# Patient Record
Sex: Female | Born: 1985 | Race: White | Hispanic: No | Marital: Married | Smoking: Never smoker
Health system: Southern US, Community
[De-identification: ages and names within clinical notes are randomized; demographics above are authoritative.]

## PROBLEM LIST (undated history)

## (undated) DIAGNOSIS — J45909 Unspecified asthma, uncomplicated: Secondary | ICD-10-CM

## (undated) DIAGNOSIS — R079 Chest pain, unspecified: Secondary | ICD-10-CM

## (undated) DIAGNOSIS — Z86718 Personal history of other venous thrombosis and embolism: Secondary | ICD-10-CM

## (undated) DIAGNOSIS — D682 Hereditary deficiency of other clotting factors: Secondary | ICD-10-CM

## (undated) HISTORY — DX: Chest pain, unspecified: R07.9

## (undated) HISTORY — PX: TONSILLECTOMY: SUR1361

## (undated) HISTORY — PX: APPENDECTOMY: SHX54

---

## 2009-10-31 DIAGNOSIS — G43909 Migraine, unspecified, not intractable, without status migrainosus: Secondary | ICD-10-CM | POA: Insufficient documentation

## 2017-08-31 DIAGNOSIS — D689 Coagulation defect, unspecified: Secondary | ICD-10-CM | POA: Insufficient documentation

## 2017-08-31 DIAGNOSIS — O321XX0 Maternal care for breech presentation, not applicable or unspecified: Secondary | ICD-10-CM | POA: Insufficient documentation

## 2017-08-31 DIAGNOSIS — R768 Other specified abnormal immunological findings in serum: Secondary | ICD-10-CM | POA: Insufficient documentation

## 2018-07-14 LAB — OB RESULTS CONSOLE HEPATITIS B SURFACE ANTIGEN: Hepatitis B Surface Ag: NEGATIVE

## 2018-07-14 LAB — OB RESULTS CONSOLE GC/CHLAMYDIA
Chlamydia: NEGATIVE
Gonorrhea: NEGATIVE

## 2018-07-14 LAB — OB RESULTS CONSOLE ANTIBODY SCREEN: Antibody Screen: NEGATIVE

## 2018-07-14 LAB — OB RESULTS CONSOLE HIV ANTIBODY (ROUTINE TESTING): HIV: NONREACTIVE

## 2018-07-14 LAB — OB RESULTS CONSOLE ABO/RH: RH Type: POSITIVE

## 2018-07-14 LAB — OB RESULTS CONSOLE RUBELLA ANTIBODY, IGM: Rubella: IMMUNE

## 2018-07-14 LAB — OB RESULTS CONSOLE RPR: RPR: NONREACTIVE

## 2019-01-06 ENCOUNTER — Telehealth (HOSPITAL_COMMUNITY): Payer: Self-pay | Admitting: *Deleted

## 2019-01-06 ENCOUNTER — Encounter (HOSPITAL_COMMUNITY): Payer: Self-pay | Admitting: *Deleted

## 2019-01-06 NOTE — Telephone Encounter (Signed)
Preadmission screen  

## 2019-01-08 ENCOUNTER — Encounter (HOSPITAL_COMMUNITY): Payer: Self-pay

## 2019-01-18 ENCOUNTER — Telehealth (HOSPITAL_COMMUNITY): Payer: Self-pay | Admitting: *Deleted

## 2019-01-18 NOTE — H&P (Signed)
Makayla Kaiser is a 33 y.o. female presenting for repeat cesarean section. 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 Hx of DVT while on OCPs. Lovenox 40mg  daily this pregnancy. OB History    Gravida  3   Para  1   Term      Preterm      AB      Living  1     SAB      TAB      Ectopic      Multiple      Live Births  1          Past Medical History:  Diagnosis Date  . Asthma   . Factor II deficiency (HCC)   . History of DVT (deep vein thrombosis)    Past Surgical History:  Procedure Laterality Date  . APPENDECTOMY    . CESAREAN SECTION    . TONSILLECTOMY     Family History: family history includes Clotting disorder in her sister; Factor V Leiden deficiency in her mother and sister; Heart disease in her mother; Hypothyroidism in her mother. Social History:  reports that she has never smoked. She has never used smokeless tobacco. She reports previous alcohol use. She reports that she does not use drugs.     Maternal Diabetes: No Genetic Screening: Normal Maternal Ultrasounds/Referrals: Normal Fetal Ultrasounds or other Referrals:  None Maternal Substance Abuse:  No Significant Maternal Medications:  Meds include: Other: Lovenox Significant Maternal Lab Results:  None Other Comments:  None  Review of Systems  Eyes: Negative for blurred vision.  Gastrointestinal: Negative for vomiting.  Neurological: Negative for headaches.   Maternal Medical History:  Fetal activity: Perceived fetal activity is normal.        Last menstrual period 04/28/2018. Exam Physical Exam  Cardiovascular: Normal rate and regular rhythm.  Respiratory: Effort normal and breath sounds normal.  GI: Soft.    Prenatal labs: ABO, Rh: B/Positive/-- (09/03 0000) Antibody: Negative (09/03 0000) Rubella: Immune (09/03 0000) RPR: Nonreactive (09/03 0000)  HBsAg: Negative (09/03 0000)  HIV: Non-reactive (09/03 0000)  GBS:   negative   01/06/19  Assessment/Plan: 33 yo G3P1 at term for repeat cesarean section Factor II Prothrombin mutation with Hx of DVT on prophylactic Lovenox   Roselle Locus II 01/18/2019, 3:44 PM

## 2019-01-18 NOTE — Telephone Encounter (Signed)
Spoke with Dr Armond Hang regarding Lovenox usage.  Instructions received to hold 3/10 dose due to CS on 3/11.  Unable to leave voicemail on pt phone.  Left message with pt physician office

## 2019-01-18 NOTE — Patient Instructions (Signed)
01/20/2019 Makayla Kaiser  01/18/2019   Your procedure is scheduled on:  01/20/2019  Arrive at 0615 at Entrance C on [address] at [facility] and [employer]. You are invited to use the FREE valet parking or use the   Visitor's parking deck.  Pick up the phone at the desk and dial [phone].  Call this number if you have problems the morning of surgery: [phone]  Remember:   Do not eat food:(After Midnight) Desps de medianoche.  Do not drink clear liquids: (After Midnight) Desps de medianoche.  Take these medicines the morning of surgery with A SIP OF WATER:  Do not take your lovenox on 3/10 or 3/11.  Bring your inhaler with you.   Do not wear jewelry, make-up or nail polish.  Do not wear lotions, powders, or perfumes. Do not wear deodorant.  Do not shave 48 hours prior to surgery.  Do not bring valuables to the hospital.  [facility] is not   responsible for any belongings or valuables brought to the hospital.  Contacts, dentures or bridgework may not be worn into surgery.  Leave suitcase in the car. After surgery it may be brought to your room.  For patients admitted to the hospital, checkout time is 11:00 AM the day of              discharge.      Please read over the following fact sheets that you were given:     Preparing for Surgery

## 2019-01-19 ENCOUNTER — Encounter (HOSPITAL_COMMUNITY)
Admission: RE | Admit: 2019-01-19 | Discharge: 2019-01-19 | Disposition: A | Discharge status: Home / Self Care | Payer: BLUE CROSS/BLUE SHIELD | Source: Ambulatory Visit | Attending: Pediatrics | Admitting: Pediatrics

## 2019-01-19 DIAGNOSIS — Z01812 Encounter for preprocedural laboratory examination: Secondary | ICD-10-CM | POA: Insufficient documentation

## 2019-01-19 HISTORY — DX: Personal history of other venous thrombosis and embolism: Z86.718

## 2019-01-19 HISTORY — DX: Unspecified asthma, uncomplicated: J45.909

## 2019-01-19 HISTORY — DX: Hereditary deficiency of other clotting factors: D68.2

## 2019-01-19 LAB — CBC
HCT: 35.8 % — ABNORMAL LOW (ref 36.0–46.0)
Hemoglobin: 11.5 g/dL — ABNORMAL LOW (ref 12.0–15.0)
MCH: 28.8 pg (ref 26.0–34.0)
MCHC: 32.1 g/dL (ref 30.0–36.0)
MCV: 89.7 fL (ref 80.0–100.0)
Platelets: 233 10*3/uL (ref 150–400)
RBC: 3.99 MIL/uL (ref 3.87–5.11)
RDW: 12.1 % (ref 11.5–15.5)
WBC: 7 10*3/uL (ref 4.0–10.5)
nRBC: 0 % (ref 0.0–0.2)

## 2019-01-19 LAB — COMPREHENSIVE METABOLIC PANEL
ALT: 21 U/L (ref 0–44)
AST: 22 U/L (ref 15–41)
Albumin: 2.9 g/dL — ABNORMAL LOW (ref 3.5–5.0)
Alkaline Phosphatase: 468 U/L — ABNORMAL HIGH (ref 38–126)
Anion gap: 9 (ref 5–15)
BILIRUBIN TOTAL: 0.4 mg/dL (ref 0.3–1.2)
BUN: 8 mg/dL (ref 6–20)
CO2: 21 mmol/L — ABNORMAL LOW (ref 22–32)
Calcium: 8.8 mg/dL — ABNORMAL LOW (ref 8.9–10.3)
Chloride: 106 mmol/L (ref 98–111)
Creatinine, Ser: 0.73 mg/dL (ref 0.44–1.00)
GFR calc Af Amer: >60 mL/min (ref >60)
GFR calc non Af Amer: >60 mL/min (ref >60)
Glucose, Bld: 99 mg/dL (ref 70–99)
Potassium: 3.6 mmol/L (ref 3.5–5.1)
Sodium: 136 mmol/L (ref 135–145)
TOTAL PROTEIN: 6.5 g/dL (ref 6.5–8.1)

## 2019-01-19 LAB — TYPE AND SCREEN
ABO/RH(D): B POS
Antibody Screen: NEGATIVE

## 2019-01-19 LAB — ABO/RH: ABO/RH(D): B POS

## 2019-01-19 NOTE — Anesthesia Preprocedure Evaluation (Addendum)
Anesthesia Evaluation  Patient identified by MRN, date of birth, ID band Patient awake    Reviewed: Allergy & Precautions, NPO status , Patient's Chart, lab work & pertinent test results  Airway Mallampati: I  TM Distance: >3 FB Neck ROM: Full    Dental no notable dental hx. (+) Teeth Intact   Pulmonary asthma ,    Pulmonary exam normal breath sounds clear to auscultation       Cardiovascular Exercise Tolerance: Good + DVT  negative cardio ROS Normal cardiovascular exam Rhythm:Regular Rate:Normal  Hx of DVT  Hx of Factor 2 Prothrombin mutation on Lovenox    Neuro/Psych negative neurological ROS  negative psych ROS   GI/Hepatic negative GI ROS, Neg liver ROS,   Endo/Other  negative endocrine ROS  Renal/GU Cr 0.73     Musculoskeletal   Abdominal   Peds  Hematology Hgb 11.5 Plt 233 T & S available   Anesthesia Other Findings   Reproductive/Obstetrics (+) Pregnancy                            Anesthesia Physical Anesthesia Plan  ASA: II  Anesthesia Plan: Spinal   Post-op Pain Management:    Induction:   PONV Risk Score and Plan: 2 and Treatment may vary due to age or medical condition, Ondansetron and Dexamethasone  Airway Management Planned: Natural Airway and Nasal Cannula  Additional Equipment:   Intra-op Plan:   Post-operative Plan:   Informed Consent: I have reviewed the patients History and Physical, chart, labs and discussed the procedure including the risks, benefits and alternatives for the proposed anesthesia with the patient or authorized representative who has indicated his/her understanding and acceptance.     Dental advisory given  Plan Discussed with: CRNA  Anesthesia Plan Comments: (Repeat C/S for Patient with hx of DVT on Lovenox last dose 3/9  T&S available)       Anesthesia Quick Evaluation

## 2019-01-20 ENCOUNTER — Encounter (HOSPITAL_COMMUNITY): Payer: Self-pay | Admitting: *Deleted

## 2019-01-20 ENCOUNTER — Inpatient Hospital Stay (HOSPITAL_COMMUNITY)
Admission: RE | Admit: 2019-01-20 | Discharge: 2019-01-21 | DRG: 787 | Disposition: A | Discharge status: Home / Self Care | Payer: BLUE CROSS/BLUE SHIELD | Attending: Obstetrics and Gynecology | Admitting: Obstetrics and Gynecology

## 2019-01-20 ENCOUNTER — Encounter (HOSPITAL_COMMUNITY)
Admission: RE | Disposition: A | Discharge status: Home / Self Care | Payer: Self-pay | Source: Home / Self Care | Attending: Obstetrics and Gynecology

## 2019-01-20 ENCOUNTER — Inpatient Hospital Stay (HOSPITAL_COMMUNITY): Payer: BLUE CROSS/BLUE SHIELD | Admitting: Anesthesiology

## 2019-01-20 DIAGNOSIS — O9912 Other diseases of the blood and blood-forming organs and certain disorders involving the immune mechanism complicating childbirth: Secondary | ICD-10-CM | POA: Diagnosis present

## 2019-01-20 DIAGNOSIS — D6852 Prothrombin gene mutation: Secondary | ICD-10-CM | POA: Diagnosis present

## 2019-01-20 DIAGNOSIS — Z3A38 38 weeks gestation of pregnancy: Secondary | ICD-10-CM | POA: Diagnosis not present

## 2019-01-20 DIAGNOSIS — J45909 Unspecified asthma, uncomplicated: Secondary | ICD-10-CM | POA: Diagnosis present

## 2019-01-20 DIAGNOSIS — O9952 Diseases of the respiratory system complicating childbirth: Secondary | ICD-10-CM | POA: Diagnosis present

## 2019-01-20 DIAGNOSIS — O34211 Maternal care for low transverse scar from previous cesarean delivery: Principal | ICD-10-CM | POA: Diagnosis present

## 2019-01-20 DIAGNOSIS — Z98891 History of uterine scar from previous surgery: Secondary | ICD-10-CM

## 2019-01-20 DIAGNOSIS — Z86718 Personal history of other venous thrombosis and embolism: Secondary | ICD-10-CM

## 2019-01-20 LAB — RPR: RPR Ser Ql: NONREACTIVE

## 2019-01-20 SURGERY — Surgical Case
Anesthesia: Spinal

## 2019-01-20 MED ORDER — ALBUTEROL SULFATE (2.5 MG/3ML) 0.083% IN NEBU: 3.0000 mL | INHALATION_SOLUTION | Freq: Four times a day (QID) | RESPIRATORY_TRACT | Status: DC | PRN

## 2019-01-20 MED ORDER — NALOXONE HCL 0.4 MG/ML IJ SOLN: 0.4000 mg | INTRAMUSCULAR | Status: DC | PRN

## 2019-01-20 MED ORDER — DIPHENHYDRAMINE HCL 25 MG PO CAPS: 25.0000 mg | ORAL_CAPSULE | Freq: Four times a day (QID) | ORAL | Status: DC | PRN

## 2019-01-20 MED ORDER — LACTATED RINGERS IV SOLN
INTRAVENOUS | Status: DC
  Administered 2019-01-20: 15:00:00 via INTRAVENOUS

## 2019-01-20 MED ORDER — COCONUT OIL OIL: 1.0000 "application " | TOPICAL_OIL | Status: DC | PRN

## 2019-01-20 MED ORDER — OXYCODONE HCL 5 MG PO TABS: 5.0000 mg | ORAL_TABLET | Freq: Once | ORAL | Status: DC | PRN

## 2019-01-20 MED ORDER — PHENYLEPHRINE HCL-NACL 20-0.9 MG/250ML-% IV SOLN
INTRAVENOUS | Status: AC
  Filled 2019-01-20: qty 250

## 2019-01-20 MED ORDER — SOD CITRATE-CITRIC ACID 500-334 MG/5ML PO SOLN
30.0000 mL | ORAL | Status: AC
  Administered 2019-01-20: 30 mL via ORAL

## 2019-01-20 MED ORDER — OXYTOCIN 40 UNITS IN NORMAL SALINE INFUSION - SIMPLE MED
INTRAVENOUS | Status: AC
  Filled 2019-01-20: qty 1000

## 2019-01-20 MED ORDER — SODIUM CHLORIDE 0.9% FLUSH: 3.0000 mL | INTRAVENOUS | Status: DC | PRN

## 2019-01-20 MED ORDER — EPHEDRINE 5 MG/ML INJ
10.0000 mg | INTRAVENOUS | Status: DC | PRN
  Filled 2019-01-20: qty 2

## 2019-01-20 MED ORDER — OXYCODONE HCL 5 MG PO TABS: 5.0000 mg | ORAL_TABLET | ORAL | Status: DC | PRN

## 2019-01-20 MED ORDER — SIMETHICONE 80 MG PO CHEW
80.0000 mg | CHEWABLE_TABLET | Freq: Three times a day (TID) | ORAL | Status: DC
  Administered 2019-01-20 – 2019-01-21 (×3): 80 mg via ORAL
  Filled 2019-01-20 (×3): qty 1

## 2019-01-20 MED ORDER — PRENATAL MULTIVITAMIN CH
1.0000 | ORAL_TABLET | Freq: Every day | ORAL | Status: DC
  Administered 2019-01-21: 1 via ORAL
  Filled 2019-01-20: qty 1

## 2019-01-20 MED ORDER — TETANUS-DIPHTH-ACELL PERTUSSIS 5-2.5-18.5 LF-MCG/0.5 IM SUSP: 0.5000 mL | Freq: Once | INTRAMUSCULAR | Status: DC

## 2019-01-20 MED ORDER — OXYTOCIN 10 UNIT/ML IJ SOLN
INTRAVENOUS | Status: DC | PRN
  Administered 2019-01-20: 40 [IU] via INTRAVENOUS

## 2019-01-20 MED ORDER — KETOROLAC TROMETHAMINE 30 MG/ML IJ SOLN
30.0000 mg | Freq: Four times a day (QID) | INTRAMUSCULAR | Status: AC | PRN
  Filled 2019-01-20 (×2): qty 1

## 2019-01-20 MED ORDER — OXYTOCIN 10 UNIT/ML IJ SOLN
INTRAMUSCULAR | Status: AC
  Filled 2019-01-20: qty 4

## 2019-01-20 MED ORDER — ONDANSETRON HCL 4 MG/2ML IJ SOLN: 4.0000 mg | Freq: Once | INTRAMUSCULAR | Status: DC | PRN

## 2019-01-20 MED ORDER — PHENYLEPHRINE 40 MCG/ML (10ML) SYRINGE FOR IV PUSH (FOR BLOOD PRESSURE SUPPORT)
80.0000 ug | PREFILLED_SYRINGE | INTRAVENOUS | Status: DC | PRN
  Filled 2019-01-20: qty 10

## 2019-01-20 MED ORDER — DIPHENHYDRAMINE HCL 25 MG PO CAPS: 25.0000 mg | ORAL_CAPSULE | ORAL | Status: DC | PRN

## 2019-01-20 MED ORDER — SIMETHICONE 80 MG PO CHEW
80.0000 mg | CHEWABLE_TABLET | ORAL | Status: DC | PRN
  Administered 2019-01-21: 80 mg via ORAL
  Filled 2019-01-20: qty 1

## 2019-01-20 MED ORDER — MENTHOL 3 MG MT LOZG: 1.0000 | LOZENGE | OROMUCOSAL | Status: DC | PRN

## 2019-01-20 MED ORDER — OXYTOCIN 40 UNITS IN NORMAL SALINE INFUSION - SIMPLE MED: 2.5000 [IU]/h | INTRAVENOUS | Status: AC

## 2019-01-20 MED ORDER — FENTANYL CITRATE (PF) 100 MCG/2ML IJ SOLN
INTRAMUSCULAR | Status: DC | PRN
  Administered 2019-01-20: 15 ug via INTRATHECAL

## 2019-01-20 MED ORDER — ONDANSETRON HCL 4 MG/2ML IJ SOLN
INTRAMUSCULAR | Status: AC
  Filled 2019-01-20: qty 2

## 2019-01-20 MED ORDER — NALBUPHINE HCL 10 MG/ML IJ SOLN: 5.0000 mg | Freq: Once | INTRAMUSCULAR | Status: DC | PRN

## 2019-01-20 MED ORDER — SENNOSIDES-DOCUSATE SODIUM 8.6-50 MG PO TABS
2.0000 | ORAL_TABLET | ORAL | Status: DC
  Administered 2019-01-21: 2 via ORAL
  Filled 2019-01-20: qty 2

## 2019-01-20 MED ORDER — MORPHINE SULFATE (PF) 0.5 MG/ML IJ SOLN
INTRAMUSCULAR | Status: DC | PRN
  Administered 2019-01-20: 150 ug via INTRATHECAL

## 2019-01-20 MED ORDER — MORPHINE SULFATE (PF) 0.5 MG/ML IJ SOLN
INTRAMUSCULAR | Status: AC
  Filled 2019-01-20: qty 10

## 2019-01-20 MED ORDER — FENTANYL CITRATE (PF) 100 MCG/2ML IJ SOLN
INTRAMUSCULAR | Status: AC
  Filled 2019-01-20: qty 2

## 2019-01-20 MED ORDER — KETOROLAC TROMETHAMINE 30 MG/ML IJ SOLN
30.0000 mg | Freq: Four times a day (QID) | INTRAMUSCULAR | Status: AC | PRN
  Administered 2019-01-20 – 2019-01-21 (×2): 30 mg via INTRAVENOUS

## 2019-01-20 MED ORDER — KETOROLAC TROMETHAMINE 30 MG/ML IJ SOLN
30.0000 mg | Freq: Once | INTRAMUSCULAR | Status: AC | PRN
  Administered 2019-01-20: 30 mg via INTRAVENOUS

## 2019-01-20 MED ORDER — NALBUPHINE HCL 10 MG/ML IJ SOLN: 5.0000 mg | INTRAMUSCULAR | Status: DC | PRN

## 2019-01-20 MED ORDER — ENOXAPARIN SODIUM 60 MG/0.6ML ~~LOC~~ SOLN
50.0000 mg | SUBCUTANEOUS | Status: DC
  Administered 2019-01-21: 50 mg via SUBCUTANEOUS
  Filled 2019-01-20: qty 0.6

## 2019-01-20 MED ORDER — MEPERIDINE HCL 25 MG/ML IJ SOLN: 6.2500 mg | INTRAMUSCULAR | Status: DC | PRN

## 2019-01-20 MED ORDER — WITCH HAZEL-GLYCERIN EX PADS: 1.0000 "application " | MEDICATED_PAD | CUTANEOUS | Status: DC | PRN

## 2019-01-20 MED ORDER — SOD CITRATE-CITRIC ACID 500-334 MG/5ML PO SOLN
ORAL | Status: AC
  Filled 2019-01-20: qty 15

## 2019-01-20 MED ORDER — ZOLPIDEM TARTRATE 5 MG PO TABS: 5.0000 mg | ORAL_TABLET | Freq: Every evening | ORAL | Status: DC | PRN

## 2019-01-20 MED ORDER — ONDANSETRON HCL 4 MG/2ML IJ SOLN
INTRAMUSCULAR | Status: DC | PRN
  Administered 2019-01-20: 4 mg via INTRAVENOUS

## 2019-01-20 MED ORDER — FENTANYL-BUPIVACAINE-NACL 0.5-0.125-0.9 MG/250ML-% EP SOLN: 12.0000 mL/h | EPIDURAL | Status: DC | PRN

## 2019-01-20 MED ORDER — SIMETHICONE 80 MG PO CHEW
80.0000 mg | CHEWABLE_TABLET | ORAL | Status: DC
  Administered 2019-01-21: 80 mg via ORAL
  Filled 2019-01-20: qty 1

## 2019-01-20 MED ORDER — HYDROMORPHONE HCL 1 MG/ML IJ SOLN: 0.2500 mg | INTRAMUSCULAR | Status: DC | PRN

## 2019-01-20 MED ORDER — DIPHENHYDRAMINE HCL 50 MG/ML IJ SOLN: 12.5000 mg | INTRAMUSCULAR | Status: DC | PRN

## 2019-01-20 MED ORDER — DIBUCAINE 1 % RE OINT: 1.0000 "application " | TOPICAL_OINTMENT | RECTAL | Status: DC | PRN

## 2019-01-20 MED ORDER — KETOROLAC TROMETHAMINE 30 MG/ML IJ SOLN
INTRAMUSCULAR | Status: AC
  Filled 2019-01-20: qty 1

## 2019-01-20 MED ORDER — PHENYLEPHRINE HCL-NACL 20-0.9 MG/250ML-% IV SOLN
INTRAVENOUS | Status: DC | PRN
  Administered 2019-01-20: 60 ug/min via INTRAVENOUS

## 2019-01-20 MED ORDER — NALOXONE HCL 4 MG/10ML IJ SOLN
1.0000 ug/kg/h | INTRAVENOUS | Status: DC | PRN
  Filled 2019-01-20: qty 5

## 2019-01-20 MED ORDER — BUPIVACAINE IN DEXTROSE 0.75-8.25 % IT SOLN
INTRATHECAL | Status: DC | PRN
  Administered 2019-01-20: 1.5 mL via INTRATHECAL

## 2019-01-20 MED ORDER — CEFAZOLIN SODIUM-DEXTROSE 2-4 GM/100ML-% IV SOLN
2.0000 g | INTRAVENOUS | Status: AC
  Administered 2019-01-20: 2 g via INTRAVENOUS

## 2019-01-20 MED ORDER — CEFAZOLIN SODIUM-DEXTROSE 2-4 GM/100ML-% IV SOLN
INTRAVENOUS | Status: AC
  Filled 2019-01-20: qty 100

## 2019-01-20 MED ORDER — OXYCODONE HCL 5 MG/5ML PO SOLN: 5.0000 mg | Freq: Once | ORAL | Status: DC | PRN

## 2019-01-20 MED ORDER — LACTATED RINGERS IV SOLN
INTRAVENOUS | Status: DC
  Administered 2019-01-20 (×3): via INTRAVENOUS

## 2019-01-20 MED ORDER — ACETAMINOPHEN 325 MG PO TABS
650.0000 mg | ORAL_TABLET | ORAL | Status: DC | PRN
  Administered 2019-01-21: 650 mg via ORAL
  Filled 2019-01-20: qty 2

## 2019-01-20 MED ORDER — SODIUM CHLORIDE 0.9 % IV SOLN
INTRAVENOUS | Status: DC | PRN
  Administered 2019-01-20: 09:00:00 via INTRAVENOUS

## 2019-01-20 MED ORDER — SCOPOLAMINE 1 MG/3DAYS TD PT72
MEDICATED_PATCH | TRANSDERMAL | Status: AC
  Filled 2019-01-20: qty 1

## 2019-01-20 MED ORDER — ONDANSETRON HCL 4 MG/2ML IJ SOLN: 4.0000 mg | Freq: Three times a day (TID) | INTRAMUSCULAR | Status: DC | PRN

## 2019-01-20 MED ORDER — LACTATED RINGERS IV SOLN: 500.0000 mL | Freq: Once | INTRAVENOUS | Status: DC

## 2019-01-20 MED ORDER — SCOPOLAMINE 1 MG/3DAYS TD PT72
1.0000 | MEDICATED_PATCH | Freq: Once | TRANSDERMAL | Status: DC
  Administered 2019-01-20: 1.5 mg via TRANSDERMAL

## 2019-01-20 SURGICAL SUPPLY — 33 items
BENZOIN TINCTURE PRP APPL 2/3 (GAUZE/BANDAGES/DRESSINGS) ×1 IMPLANT
CHLORAPREP W/TINT 26ML (MISCELLANEOUS) ×2 IMPLANT
CLAMP CORD UMBIL (MISCELLANEOUS) IMPLANT
CLOTH BEACON ORANGE TIMEOUT ST (SAFETY) ×2 IMPLANT
DERMABOND ADVANCED (GAUZE/BANDAGES/DRESSINGS)
DERMABOND ADVANCED .7 DNX12 (GAUZE/BANDAGES/DRESSINGS) IMPLANT
DRSG OPSITE POSTOP 4X10 (GAUZE/BANDAGES/DRESSINGS) ×2 IMPLANT
ELECT REM PT RETURN 9FT ADLT (ELECTROSURGICAL) ×2
ELECTRODE REM PT RTRN 9FT ADLT (ELECTROSURGICAL) ×1 IMPLANT
EXTRACTOR VACUUM M CUP 4 TUBE (SUCTIONS) IMPLANT
GLOVE BIO SURGEON STRL SZ7.5 (GLOVE) ×2 IMPLANT
GLOVE BIOGEL PI IND STRL 7.0 (GLOVE) ×1 IMPLANT
GLOVE BIOGEL PI INDICATOR 7.0 (GLOVE) ×1
GOWN STRL REUS W/TWL LRG LVL3 (GOWN DISPOSABLE) ×4 IMPLANT
KIT ABG SYR 3ML LUER SLIP (SYRINGE) ×2 IMPLANT
NDL HYPO 25X5/8 SAFETYGLIDE (NEEDLE) ×1 IMPLANT
NEEDLE HYPO 25X5/8 SAFETYGLIDE (NEEDLE) ×2 IMPLANT
NS IRRIG 1000ML POUR BTL (IV SOLUTION) ×2 IMPLANT
PACK C SECTION WH (CUSTOM PROCEDURE TRAY) ×2 IMPLANT
PAD OB MATERNITY 4.3X12.25 (PERSONAL CARE ITEMS) ×2 IMPLANT
PENCIL SMOKE EVAC W/HOLSTER (ELECTROSURGICAL) ×2 IMPLANT
STRIP CLOSURE SKIN 1/2X4 (GAUZE/BANDAGES/DRESSINGS) ×1 IMPLANT
SUT MNCRL 0 VIOLET CTX 36 (SUTURE) ×4 IMPLANT
SUT MONOCRYL 0 CTX 36 (SUTURE) ×4
SUT PDS AB 0 CTX 60 (SUTURE) ×2 IMPLANT
SUT PLAIN 0 NONE (SUTURE) IMPLANT
SUT PLAIN 2 0 (SUTURE)
SUT PLAIN 2 0 XLH (SUTURE) IMPLANT
SUT PLAIN ABS 2-0 CT1 27XMFL (SUTURE) IMPLANT
SUT VIC AB 4-0 KS 27 (SUTURE) ×2 IMPLANT
TOWEL OR 17X24 6PK STRL BLUE (TOWEL DISPOSABLE) ×2 IMPLANT
TRAY FOLEY W/BAG SLVR 14FR LF (SET/KITS/TRAYS/PACK) ×2 IMPLANT
WATER STERILE IRR 1000ML POUR (IV SOLUTION) ×2 IMPLANT

## 2019-01-20 NOTE — Op Note (Signed)
NAME: Makayla Kaiser, Makayla Kaiser [facility] MEDICAL RECORD [id number] ACCOUNT [id number] DATE OF BIRTH:11/11/1986 FACILITY: [facility] LOCATION: [id number] PHYSICIAN:Sweetie Giebler E. Iesha Summerhill II, MD  OPERATIVE REPORT  DATE OF PROCEDURE:  01/20/2019  PREOPERATIVE DIAGNOSES: 1.  Intrauterine pregnancy at 46 and 1/7 weeks. 2.  Previous cesarean section, desires repeat. 3.  Prothrombin factor II mutation.  POSTOPERATIVE DIAGNOSES:   1.  Intrauterine pregnancy at 89 and 1/7 weeks. 2.  Previous cesarean section, desires repeat. 3.  Prothrombin factor II mutation.  PROCEDURE:  Repeat low transverse cesarean section.  SURGEON:  Harold Hedge MD  ANESTHESIA:  Spinal.  ESTIMATED BLOOD LOSS:  Per anesthesia note.  FINDINGS:  Viable female infant.  Apgars, arterial pH and birth weight pending.  INDICATIONS AND CONSENT:  This patient is a 33 year old, G3 P1 with a previous cesarean section.  Pregnancy is complicated by a factor 2 prothrombin mutation and a history of DVT on the birth control pill.  She has been taking Lovenox 40 mg daily in a  prophylactic dose.  She took her last dose of Lovenox the day before yesterday.  She desires repeat cesarean section.  Potential risks and complications have been reviewed and all questions have been answered.  Consent is signed on the chart.  DESCRIPTION OF PROCEDURE:  The patient was taken to the operating room where her spinal anesthetic was placed per anesthesiology.  She was placed in the dorsal supine position with a 15 degree left lateral wedge.  She was prepped vaginally with Betadine.   Foley catheter was placed and prepped abdominally with ChloraPrep.  After a 3-minute drying time, timeout was undertaken and she was then draped in a sterile fashion.  After testing for adequate anesthesia, skin was entered through a Pfannenstiel scar.   Dissection was carried out in layers to the peritoneum which was taken down superiorly and inferiorly.  Vesicouterine peritoneum was  advanced.  The scar from the previous cesarean section has created a very thin window.  A low transverse incision was  made on the uterus just above this thin scar.  Clear fluid was noted.  The uterine incision was extended laterally with the fingers.  Baby was delivered from the vertex position.  There was a loose cord around the shoulders.  Good cry and tone is noted.   After 1 minute waiting time, the cord was clamped and cut and the baby was handed to waiting pediatrics team.  Placenta was manually delivered and sent to pathology.  Uterine cavity is clean.  Uterus was carefully closed in 2 running locking imbricating  layers of 0 Monocryl suture.  Good hemostasis was noted.  Lavage was carried out.  Anterior peritoneum was closed in a running fashion with 0 Monocryl suture, which was also used to reapproximate the pyramidalis muscle in the midline.  The anterior  rectus fascia was closed in a running fashion with a 0 looped PDS suture.  Subcutaneous layer was closed with interrupted plain suture and the skin was closed in a subcuticular fashion with 4-0 Vicryl on a Keith needle.  Dressings are applied.  All  counts were correct.  The patient was taken to recovery room in stable condition.  TN/NUANCE  D:01/20/2019 T:01/20/2019 JOB:[id number]

## 2019-01-20 NOTE — Brief Op Note (Signed)
01/20/2019  8:55 AM  PATIENT:  Makayla Kaiser  33 y.o. female  PRE-OPERATIVE DIAGNOSIS:  previous X 1, on lovenox  POST-OPERATIVE DIAGNOSIS:  Previous X1, desires repeat, on Lovenox  PROCEDURE:  Procedure(s) with comments: CESAREAN SECTION (N/A) - Repeat edc 02/02/19 NKDA Tracey RNFA  SURGEON:  Surgeon(s) and Role:    * Harold Hedge, MD - Primary  PHYSICIAN ASSISTANT:   ASSISTANTS: Pearson Forster RNFA   ANESTHESIA:   spinal  EBL:  Per anesthesia note  BLOOD ADMINISTERED:none  DRAINS: Urinary Catheter (Foley)   LOCAL MEDICATIONS USED:  NONE  SPECIMEN:  Source of Specimen:  placenta  DISPOSITION OF SPECIMEN:  PATHOLOGY  COUNTS:  YES  TOURNIQUET:  * No tourniquets in log *  DICTATION: .Other Dictation: Dictation Number [id number]  PLAN OF CARE: Admit to inpatient   PATIENT DISPOSITION:  PACU - hemodynamically stable.   Delay start of Pharmacological VTE agent (>24hrs) due to surgical blood loss or risk of bleeding: not applicable

## 2019-01-20 NOTE — Anesthesia Postprocedure Evaluation (Signed)
Anesthesia Post Note  Patient: Makayla Kaiser  Procedure(s) Performed: CESAREAN SECTION (N/A )     Patient location during evaluation: Mother Baby Anesthesia Type: Spinal Level of consciousness: awake and alert and oriented Pain management: satisfactory to patient Vital Signs Assessment: post-procedure vital signs reviewed and stable Respiratory status: respiratory function stable and spontaneous breathing Cardiovascular status: blood pressure returned to baseline Postop Assessment: no headache, no backache, spinal receding, patient able to bend at knees and adequate PO intake Anesthetic complications: no    Last Vitals:  Vitals:   01/20/19 1053 01/20/19 1058  BP: 99/64   Pulse: 69   Resp: 16   Temp:  37.1 C  SpO2:      Last Pain:  Vitals:   01/20/19 1353  TempSrc:   PainSc: 3    Pain Goal:                   Jeyson Deshotel

## 2019-01-20 NOTE — Progress Notes (Signed)
No changes to H&P per patient history Last Lovenox on 01/18/19 Reviewed with patient procedure-repeat cesarean section All questions answered

## 2019-01-20 NOTE — Transfer of Care (Signed)
Immediate Anesthesia Transfer of Care Note  Patient: Makayla Kaiser  Procedure(s) Performed: CESAREAN SECTION (N/A )  Patient Location: PACU  Anesthesia Type:Spinal  Level of Consciousness: awake, alert  and oriented  Airway & Oxygen Therapy: Patient Spontanous Breathing  Post-op Assessment: Report given to RN and Post -op Vital signs reviewed and stable  Post vital signs: Reviewed and stable  Last Vitals:  Vitals Value Taken Time  BP 93/69 01/20/2019  9:15 AM  Temp    Pulse 72 01/20/2019  9:18 AM  Resp 17 01/20/2019  9:18 AM  SpO2 93 % 01/20/2019  9:18 AM  Vitals shown include unvalidated device data.  Last Pain:  Vitals:   01/20/19 0223  TempSrc: Oral         Complications: No apparent anesthesia complications

## 2019-01-20 NOTE — Anesthesia Procedure Notes (Signed)
Spinal  Patient location during procedure: OB Start time: 01/20/2019 8:05 AM End time: 01/20/2019 8:12 AM Staffing Anesthesiologist: Trevor Iha, MD Performed: anesthesiologist  Preanesthetic Checklist Completed: patient identified, surgical consent, pre-op evaluation, timeout performed, IV checked, risks and benefits discussed and monitors and equipment checked Spinal Block Patient position: sitting Prep: site prepped and draped and DuraPrep Patient monitoring: heart rate, cardiac monitor, continuous pulse ox and blood pressure Approach: midline Location: L3-4 Injection technique: single-shot Needle Needle type: Pencan  Needle gauge: 24 G Needle length: 10 cm Needle insertion depth: 7 cm Assessment Sensory level: T4 Additional Notes 2 Attempt (s). Pt tolerated procedure well.

## 2019-01-21 LAB — CBC
HCT: 30.8 % — ABNORMAL LOW (ref 36.0–46.0)
Hemoglobin: 9.7 g/dL — ABNORMAL LOW (ref 12.0–15.0)
MCH: 28.6 pg (ref 26.0–34.0)
MCHC: 31.5 g/dL (ref 30.0–36.0)
MCV: 90.9 fL (ref 80.0–100.0)
Platelets: 180 10*3/uL (ref 150–400)
RBC: 3.39 MIL/uL — AB (ref 3.87–5.11)
RDW: 12.3 % (ref 11.5–15.5)
WBC: 8.4 10*3/uL (ref 4.0–10.5)
nRBC: 0 % (ref 0.0–0.2)

## 2019-01-21 MED ORDER — IBUPROFEN 800 MG PO TABS
800.0000 mg | ORAL_TABLET | Freq: Four times a day (QID) | ORAL | Status: DC | PRN
  Administered 2019-01-21: 800 mg via ORAL
  Filled 2019-01-21: qty 1

## 2019-01-21 MED ORDER — OXYCODONE-ACETAMINOPHEN 5-325 MG PO TABS: 1.0000 | ORAL_TABLET | ORAL | 0 refills | Status: DC | PRN

## 2019-01-21 MED ORDER — IBUPROFEN 800 MG PO TABS: 800.0000 mg | ORAL_TABLET | Freq: Four times a day (QID) | ORAL | 0 refills | Status: DC | PRN

## 2019-01-21 NOTE — Discharge Instructions (Signed)
Call MD for T>100.4, heavy vaginal bleeding, severe abdominal pain, intractable nausea and/or vomiting, or respiratory distress.  Call office to schedule postop incision check in 1 weeks.  Pelvic rest x 6 weeks.  No driving while taking narcotics.   °

## 2019-01-21 NOTE — Progress Notes (Signed)
Subjective: Postpartum Day 1: Cesarean Delivery Patient reports tolerating PO, + flatus and no problems voiding.   Patient adamantly wants to go home.  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    Objective: Vital signs in last 24 hours: Temp:  [97.8 F (36.6 C)-100 F (37.8 C)] 97.8 F (36.6 C) (03/12 0721) Pulse Rate:  [60-66] 64 (03/12 0721) Resp:  [16-19] 18 (03/12 0721) BP: (91-108)/(57-70) 92/70 (03/12 0721) SpO2:  [95 %-99 %] 98 % (03/12 0721)  Physical Exam:  General: alert, cooperative and appears stated age 33: appropriate Uterine Fundus: firm Incision: healing well, no significant drainage, no dehiscence, clean and dry dressing after change in honeycomb this am DVT Evaluation: No evidence of DVT seen on physical exam. Negative Homan's sign. No cords or calf tenderness.  Recent Labs    01/19/19 0933 01/21/19 0621  HGB 11.5* 9.7*  HCT 35.8* 30.8*    Assessment/Plan: Status post Cesarean section. Doing well postoperatively.  Patient counseled that routinely, we do not send C/S postop patients home until day 2.  Patient understands.  I spoke with her surgeon, Dr. Henderson Cloud, who reports the surgery was uncomplicated.  Patient is comfortable with precautions and will call or return should she have any concerns.   Discharge home with standard precautions and return to clinic in 1 week.  [name] 01/21/2019, 12:45 PM

## 2019-01-21 NOTE — Lactation Note (Signed)
This note was copied from a baby's chart. Lactation Consultation Note  Patient Name: Makayla Kaiser PYYFR'T Date: 01/21/2019 Reason for consult: Initial assessment;Early term 80-38.6wks  P2 mother whose infant is now 66 hours old.  Mother breast fed her first child (now 64 months old) for 9 months and stopped when she found out she was pregnant.  Pediatrician in room when I arrived.  Mother desires a discharge home at 24 hours and pediatrician aware of this.  Mother had no questions/concerns related to breast feeding and feels like baby is latching and feeding well.  Her breasts are soft and non tender and nipples are intact.  Mother will continue feeding 8-12 times/24 hours or sooner if baby shows feeding cues.  Engorgement prevention/treatment discussed.  Mother has a manual pump and 2 DEBPs for home use.  Encouraged her to call me if needed prior to discharge.   Maternal Data Formula Feeding for Exclusion: No Has patient been taught Hand Expression?: Yes Does the patient have breastfeeding experience prior to this delivery?: Yes  Feeding    LATCH Score                   Interventions    Lactation Tools Discussed/Used WIC Program: No   Consult Status Consult Status: Complete    Makayla Kaiser 01/21/2019, 10:37 AM

## 2019-01-21 NOTE — Progress Notes (Signed)
Upon fundal assessment this morning, RN noticed dressing to be moderately covered with drainage. Order obtained to change it. Dressing changed per order with assistance of Christella Hartigan, Charity fundraiser. Dressing now clean, dry, intact.

## 2019-01-21 NOTE — Discharge Summary (Signed)
Obstetric Discharge Summary Reason for Admission: cesarean section Prenatal Procedures: none Intrapartum Procedures: cesarean: low cervical, transverse Postpartum Procedures: none Complications-Operative and Postpartum: none Hemoglobin  Date Value Ref Range Status  01/21/2019 9.7 (L) 12.0 - 15.0 g/dL Final   HCT  Date Value Ref Range Status  01/21/2019 30.8 (L) 36.0 - 46.0 % Final    Physical Exam:  General: alert, cooperative and appears stated age 33: appropriate Uterine Fundus: firm Incision: healing well, no significant drainage, no dehiscence DVT Evaluation: No evidence of DVT seen on physical exam. Negative Homan's sign. No cords or calf tenderness.  Discharge Diagnoses: Term Pregnancy-delivered  Discharge Information: Date: 01/21/2019 Activity: pelvic rest Diet: routine Medications: PNV, Ibuprofen and Percocet Condition: stable Instructions: refer to practice specific booklet Discharge to: home   Newborn Data: Live born female  Birth Weight: 6 lb 1.7 oz (2770 g) APGAR: 9, 9  Newborn Delivery   Birth date/time:  01/20/2019 08:32:00 Delivery type:  C-Section, Low Transverse Trial of labor:  No C-section categorization:  Repeat     Home with mother.  [name] 01/21/2019, 12:51 PM

## 2019-01-21 NOTE — Lactation Note (Signed)
This note was copied from a baby's chart. Lactation Consultation Note  Patient Name: Makayla Kaiser [id number] Date: 01/21/2019  [facility] entered room at 04:50 am and Mom and infant asleep,   Maternal Data    Feeding Feeding Type: Breast Fed  LATCH Score                   Interventions    Lactation Tools Discussed/Used     Consult Status      Makayla Kaiser 01/21/2019, 6:37 AM

## 2020-12-21 LAB — OB RESULTS CONSOLE ABO/RH: RH Type: POSITIVE

## 2020-12-21 LAB — OB RESULTS CONSOLE RUBELLA ANTIBODY, IGM: Rubella: IMMUNE

## 2020-12-21 LAB — OB RESULTS CONSOLE HIV ANTIBODY (ROUTINE TESTING): HIV: NONREACTIVE

## 2020-12-21 LAB — OB RESULTS CONSOLE RPR: RPR: NONREACTIVE

## 2020-12-21 LAB — OB RESULTS CONSOLE HEPATITIS B SURFACE ANTIGEN: Hepatitis B Surface Ag: NEGATIVE

## 2020-12-21 LAB — OB RESULTS CONSOLE ANTIBODY SCREEN: Antibody Screen: NEGATIVE

## 2021-01-12 LAB — HM PAP SMEAR
HM Pap smear: NEGATIVE
HPV, high-risk: NEGATIVE

## 2021-01-12 LAB — RESULTS CONSOLE HPV: CHL HPV: NEGATIVE

## 2021-01-17 ENCOUNTER — Emergency Department (HOSPITAL_BASED_OUTPATIENT_CLINIC_OR_DEPARTMENT_OTHER): Payer: Commercial Managed Care - PPO | Admitting: Radiology

## 2021-01-17 ENCOUNTER — Emergency Department (HOSPITAL_BASED_OUTPATIENT_CLINIC_OR_DEPARTMENT_OTHER)
Admission: EM | Admit: 2021-01-17 | Discharge: 2021-01-17 | Disposition: A | Discharge status: Home / Self Care | Payer: Commercial Managed Care - PPO | Attending: Emergency Medicine | Admitting: Emergency Medicine

## 2021-01-17 ENCOUNTER — Other Ambulatory Visit: Payer: Self-pay

## 2021-01-17 ENCOUNTER — Encounter (HOSPITAL_BASED_OUTPATIENT_CLINIC_OR_DEPARTMENT_OTHER): Payer: Self-pay

## 2021-01-17 ENCOUNTER — Emergency Department (HOSPITAL_BASED_OUTPATIENT_CLINIC_OR_DEPARTMENT_OTHER): Payer: Commercial Managed Care - PPO

## 2021-01-17 DIAGNOSIS — Z79899 Other long term (current) drug therapy: Secondary | ICD-10-CM | POA: Insufficient documentation

## 2021-01-17 DIAGNOSIS — O99611 Diseases of the digestive system complicating pregnancy, first trimester: Secondary | ICD-10-CM | POA: Insufficient documentation

## 2021-01-17 DIAGNOSIS — J45909 Unspecified asthma, uncomplicated: Secondary | ICD-10-CM | POA: Insufficient documentation

## 2021-01-17 DIAGNOSIS — K8020 Calculus of gallbladder without cholecystitis without obstruction: Secondary | ICD-10-CM | POA: Diagnosis not present

## 2021-01-17 DIAGNOSIS — Z3A12 12 weeks gestation of pregnancy: Secondary | ICD-10-CM | POA: Diagnosis not present

## 2021-01-17 DIAGNOSIS — K859 Acute pancreatitis without necrosis or infection, unspecified: Secondary | ICD-10-CM | POA: Insufficient documentation

## 2021-01-17 LAB — COMPREHENSIVE METABOLIC PANEL
ALT: 18 U/L (ref 0–44)
AST: 18 U/L (ref 15–41)
Albumin: 4 g/dL (ref 3.5–5.0)
Alkaline Phosphatase: 60 U/L (ref 38–126)
Anion gap: 9 (ref 5–15)
BUN: 12 mg/dL (ref 6–20)
CO2: 25 mmol/L (ref 22–32)
Calcium: 9.3 mg/dL (ref 8.9–10.3)
Chloride: 103 mmol/L (ref 98–111)
Creatinine, Ser: 0.71 mg/dL (ref 0.44–1.00)
GFR, Estimated: >60 mL/min (ref >60)
Glucose, Bld: 78 mg/dL (ref 70–99)
Potassium: 3.7 mmol/L (ref 3.5–5.1)
Sodium: 137 mmol/L (ref 135–145)
Total Bilirubin: 0.2 mg/dL — ABNORMAL LOW (ref 0.3–1.2)
Total Protein: 7.3 g/dL (ref 6.5–8.1)

## 2021-01-17 LAB — CBC WITH DIFFERENTIAL/PLATELET
Abs Immature Granulocytes: 0.03 10*3/uL (ref 0.00–0.07)
Basophils Absolute: 0 10*3/uL (ref 0.0–0.1)
Basophils Relative: 0 %
Eosinophils Absolute: 0.1 10*3/uL (ref 0.0–0.5)
Eosinophils Relative: 1 %
HCT: 37.4 % (ref 36.0–46.0)
Hemoglobin: 12.4 g/dL (ref 12.0–15.0)
Immature Granulocytes: 0 %
Lymphocytes Relative: 20 %
Lymphs Abs: 1.8 10*3/uL (ref 0.7–4.0)
MCH: 29.5 pg (ref 26.0–34.0)
MCHC: 33.2 g/dL (ref 30.0–36.0)
MCV: 89 fL (ref 80.0–100.0)
Monocytes Absolute: 0.6 10*3/uL (ref 0.1–1.0)
Monocytes Relative: 7 %
Neutro Abs: 6.3 10*3/uL (ref 1.7–7.7)
Neutrophils Relative %: 72 %
Platelets: 260 10*3/uL (ref 150–400)
RBC: 4.2 MIL/uL (ref 3.87–5.11)
RDW: 12.3 % (ref 11.5–15.5)
WBC: 8.9 10*3/uL (ref 4.0–10.5)
nRBC: 0 % (ref 0.0–0.2)

## 2021-01-17 LAB — LIPASE, BLOOD: Lipase: 310 U/L — ABNORMAL HIGH (ref 11–51)

## 2021-01-17 LAB — TROPONIN I (HIGH SENSITIVITY)
Troponin I (High Sensitivity): 2 ng/L (ref <18)
Troponin I (High Sensitivity): 3 ng/L (ref <18)

## 2021-01-17 LAB — D-DIMER, QUANTITATIVE: D-Dimer, Quant: 0.3 ug/mL-FEU (ref 0.00–0.50)

## 2021-01-17 MED ORDER — ASPIRIN 81 MG PO CHEW
81.0000 mg | CHEWABLE_TABLET | Freq: Once | ORAL | Status: AC
  Administered 2021-01-17: 81 mg via ORAL
  Filled 2021-01-17: qty 1

## 2021-01-17 MED ORDER — FAMOTIDINE 20 MG PO TABS
20.0000 mg | ORAL_TABLET | Freq: Once | ORAL | Status: AC
  Administered 2021-01-17: 20 mg via ORAL
  Filled 2021-01-17: qty 1

## 2021-01-17 NOTE — ED Notes (Signed)
Patient transported to X-ray 

## 2021-01-17 NOTE — ED Triage Notes (Signed)
She c/o central chest pain which began earlier today while at rest. She tells me she is ~[redacted] weeks gestation, Grav III; PARA II. She further tells me she has a coagulopathy (Factor II issue). She is in no distress. her husband is at bedside.

## 2021-01-17 NOTE — ED Provider Notes (Signed)
[facility] EMERGENCY DEPT Provider Note   CSN: [id number] Arrival date & time: 01/17/21  1524     History Chief Complaint  Patient presents with  . Chest Pain    Makayla Kaiser is a 35 y.o. female.  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  Shortly prior to arrival, the patient was sitting when she began having lower central chest pain with pain in her upper back.  Pain is a squeezing tightness that is constant since it began and is associated with mild shortness of breath.  No pain with deep inspiration.  No nausea or vomiting and no associated abdominal pain.  She has had reflux with her pregnancy but states this feels different; she has never had pain like this before.  She denies any cough/cold symptoms, recent travel, or recent illness.  No leg swelling or pain.  Regarding her pregnancy, she is following with an OB/GYN for high risk pregnancy and is compliant with Lovenox.  She denies any pregnancy related complaints including no abdominal pain, vaginal bleeding/discharge.  FH notable for mother with sudden cardiac arrest in her 16s; required CABG.   The history is provided by the patient.  Chest Pain      Past Medical History:  Diagnosis Date  . Asthma   . Factor II deficiency (HCC)   . History of DVT (deep vein thrombosis)     Patient Active Problem List   Diagnosis Date Noted  . Previous cesarean section 01/20/2019  . Cesarean delivery delivered 01/20/2019    Past Surgical History:  Procedure Laterality Date  . APPENDECTOMY    . CESAREAN SECTION    . CESAREAN SECTION N/A 01/20/2019   Procedure: CESAREAN SECTION;  Surgeon: Harold Hedge, MD;  Location: [facility] LD [facility]S;  Service: Obstetrics;  Laterality: N/A;  Repeat edc 02/02/19 NKDA Tracey RNFA  . TONSILLECTOMY       OB History    Gravida  4   Para  2    Term  1   Preterm      AB      Living  2     SAB      IAB      Ectopic      Multiple  0   Live Births  2           Family History  Problem Relation Age of Onset  . Factor V Leiden deficiency Mother   . Hypothyroidism Mother   . Heart disease Mother   . Factor V Leiden deficiency Sister   . Clotting disorder Sister     Social History   Tobacco Use  . Smoking status: Never Smoker  . Smokeless tobacco: Never Used  Vaping Use  . Vaping Use: Never used  Substance Use Topics  . Alcohol use: Not Currently  . Drug use: Never    Home Medications Prior to Admission medications   Medication Sig Start Date End Date Taking? Authorizing Provider  acetaminophen (TYLENOL) 500 MG tablet Take 500 mg by mouth every 6 (six) hours as needed for moderate pain.    [provider]  albuterol (PROVENTIL HFA;VENTOLIN HFA) 108 (90 Base) MCG/ACT inhaler Inhale 1-2 puffs into the lungs every 6 (six) hours as needed for wheezing or shortness of breath.    [provider]  amphetamine-dextroamphetamine (ADDERALL) 10 MG tablet Take 10 mg by mouth  daily with breakfast.    [provider]  enoxaparin (LOVENOX) 40 MG/0.4ML injection Inject 40 mg into the skin daily.    [provider]  ibuprofen (ADVIL,MOTRIN) 800 MG tablet Take 1 tablet (800 mg total) by mouth every 6 (six) hours as needed for moderate pain. 01/21/19   Morris, Aundra Millet, DO  loratadine (CLARITIN) 10 MG tablet Take 10 mg by mouth daily.    [provider]  oxyCODONE-acetaminophen (PERCOCET) 5-325 MG tablet Take 1-2 tablets by mouth every 4 (four) hours as needed for severe pain. 01/21/19   [name], DO  Prenatal Vit-Fe Fumarate-FA (PRENATAL MULTIVITAMIN) TABS tablet Take 1 tablet by mouth daily at 12 noon.    [provider]    Allergies    Patient has no known allergies.  Review of Systems   Review of Systems  Cardiovascular: Positive for chest pain.   All other  systems reviewed and are negative except that which was mentioned in HPI  Physical Exam Updated Vital Signs BP 122/71 (BP Location: Right Arm)   Pulse 71   Temp 98.1 F (36.7 C) (Oral)   Resp 19   SpO2 98%   Breastfeeding No   Physical Exam Vitals and nursing note reviewed.  Constitutional:      General: She is not in acute distress.    Appearance: She is well-developed and well-nourished.  HENT:     Head: Normocephalic and atraumatic.  Eyes:     Conjunctiva/sclera: Conjunctivae normal.  Cardiovascular:     Rate and Rhythm: Normal rate and regular rhythm.     Heart sounds: No murmur heard.   Pulmonary:     Effort: Pulmonary effort is normal. No respiratory distress.     Breath sounds: Normal breath sounds.  Abdominal:     Palpations: Abdomen is soft.     Tenderness: There is no abdominal tenderness.  Musculoskeletal:        General: No edema.     Cervical back: Neck supple.     Right lower leg: No tenderness. No edema.     Left lower leg: No tenderness. No edema.     Comments: No thoracic back tenderness  Skin:    General: Skin is warm and dry.  Neurological:     General: No focal deficit present.     Mental Status: She is alert.  Psychiatric:        Mood and Affect: Mood and affect normal.     Comments: Mildly anxious but pleasant     ED Results / Procedures / Treatments   Labs (all labs ordered are listed, but only abnormal results are displayed) Labs Reviewed  COMPREHENSIVE METABOLIC PANEL - Abnormal; Notable for the following components:      Result Value   Total Bilirubin 0.2 (*)    All other components within normal limits  LIPASE, BLOOD - Abnormal; Notable for the following components:   Lipase 310 (*)    All other components within normal limits  CBC WITH DIFFERENTIAL/PLATELET  D-DIMER, QUANTITATIVE  TROPONIN I (HIGH SENSITIVITY)  TROPONIN I (HIGH SENSITIVITY)    EKG EKG Interpretation  Date/Time:  Wednesday January 17 2021 15:40:17  EST Ventricular Rate:  110 PR Interval:    QRS Duration: 126 QT Interval:  334 QTC Calculation: 452 R Axis:   79 Text Interpretation: Sinus tachycardia Nonspecific intraventricular conduction delay No previous ECGs available Confirmed by Frederick Peers [phone]) on 01/17/2021 3:46:37 PM   Radiology DG Chest 2 View  Result Date:  01/17/2021 CLINICAL DATA:  Central chest pain. EXAM: CHEST - 2 VIEW COMPARISON:  None. FINDINGS: The heart size and mediastinal contours are within normal limits. Both lungs are clear. The visualized skeletal structures are unremarkable. IMPRESSION: No active cardiopulmonary disease. Electronically Signed   By: Aram Candela M.D.   On: 01/17/2021 16:31   US Abdomen Limited  Result Date: 01/17/2021 CLINICAL DATA:  Elevated lipase. EXAM: ULTRASOUND ABDOMEN LIMITED RIGHT UPPER QUADRANT COMPARISON:  None. FINDINGS: Gallbladder: Tiny mobile echogenic foci are seen within the dependent portion of the gallbladder lumen. There is no evidence of gallbladder wall thickening (1.3 mm). No sonographic Murphy sign noted by sonographer. Common bile duct: Diameter: 6.0 cm Liver: No focal lesion identified. Within normal limits in parenchymal echogenicity. Portal vein is patent on color Doppler imaging with normal direction of blood flow towards the liver. Other: None. IMPRESSION: Cholelithiasis versus gallbladder sludge without evidence of acute cholecystitis. Electronically Signed   By: Aram Candela M.D.   On: 01/17/2021 19:01    Procedures Procedures   Medications Ordered in ED Medications  aspirin chewable tablet 81 mg (81 mg Oral Given 01/17/21 1640)  famotidine (PEPCID) tablet 20 mg (20 mg Oral Given 01/17/21 1640)    ED Course  I have reviewed the triage vital signs and the nursing notes.  Pertinent labs & imaging results that were available during my care of the patient were reviewed by me and considered in my medical decision making (see chart for details).    MDM  Rules/Calculators/A&P                          Well appearing on exam, mildly hypertensive but admits to being anxious and hasn't had BP problems during pregnancy. EKG shows sinus tach, no acute ischemic changes. Gave 81mg  ASA as she has been instructed to take this during pregnancy, also wanted to try pepcid. Had long discussion regarding risk of PE and options regarding w/u of PE. She and husband wanted to proceed w/ D-dimer.  Labs show negative serial trops, normal LFTs and bilirubin 0.2, negative d-dimer, normal CBC. Lipase elevated at 310. On reassessment, she states pain has resolved. Given elevated lipase and description of pain, ddx includes gallstones. She agreed to proceed w/ . US shows cholelithiasis vs sludge with no acute cholecystitis and normal CBD diameter. She remains pain-free. She may have passed a small stone through CBD causing transient obstruction and subsequent mild gallstone pancreatitis, however w/ the rest of her labs being normal and pain resolved w/ normal CBD measurements, I do not feel that she has choledocolithiasis. She feels well and is comfortable going home, following with her OBGYN as well as general surgery. I counseled on supportive measures including dietary measures for gallstones.  I have extensively reviewed return precautions with her including return of pain symptoms, vomiting, or fever and she voiced understanding. Final Clinical Impression(s) / ED Diagnoses Final diagnoses:  Calculus of gallbladder without cholecystitis without obstruction  Acute pancreatitis without infection or necrosis, unspecified pancreatitis type    Rx / DC Orders ED Discharge Orders    None       Ivonna Kinnick, [place], MD 01/17/21 2056

## 2021-01-17 NOTE — ED Notes (Signed)
Patient transported to Ultrasound 

## 2021-01-17 NOTE — ED Notes (Signed)
Assumed care of this patient. Vitals taken. Pt denies pain at this time. A&Ox4. NAD. Respirations regular/unlabored.  Connected to cardiac monitor, BP, pulse ox. Stretcher low, wheels locked, call bell within reach.

## 2021-06-01 ENCOUNTER — Other Ambulatory Visit: Payer: Self-pay

## 2021-06-01 ENCOUNTER — Inpatient Hospital Stay (HOSPITAL_COMMUNITY)
Admission: AD | Admit: 2021-06-01 | Discharge: 2021-06-02 | Disposition: A | Discharge status: Home / Self Care | Payer: Commercial Managed Care - PPO | Attending: Obstetrics and Gynecology | Admitting: Obstetrics and Gynecology

## 2021-06-01 ENCOUNTER — Encounter (HOSPITAL_COMMUNITY): Payer: Self-pay | Admitting: Obstetrics and Gynecology

## 2021-06-01 DIAGNOSIS — O30043 Twin pregnancy, dichorionic/diamniotic, third trimester: Secondary | ICD-10-CM | POA: Insufficient documentation

## 2021-06-01 DIAGNOSIS — Z79899 Other long term (current) drug therapy: Secondary | ICD-10-CM | POA: Insufficient documentation

## 2021-06-01 DIAGNOSIS — O4703 False labor before 37 completed weeks of gestation, third trimester: Secondary | ICD-10-CM | POA: Diagnosis not present

## 2021-06-01 DIAGNOSIS — Z86718 Personal history of other venous thrombosis and embolism: Secondary | ICD-10-CM | POA: Insufficient documentation

## 2021-06-01 DIAGNOSIS — O09523 Supervision of elderly multigravida, third trimester: Secondary | ICD-10-CM | POA: Diagnosis not present

## 2021-06-01 DIAGNOSIS — Z7901 Long term (current) use of anticoagulants: Secondary | ICD-10-CM | POA: Diagnosis not present

## 2021-06-01 DIAGNOSIS — R109 Unspecified abdominal pain: Secondary | ICD-10-CM | POA: Diagnosis present

## 2021-06-01 DIAGNOSIS — Z3A31 31 weeks gestation of pregnancy: Secondary | ICD-10-CM | POA: Diagnosis not present

## 2021-06-01 DIAGNOSIS — O4700 False labor before 37 completed weeks of gestation, unspecified trimester: Secondary | ICD-10-CM

## 2021-06-01 LAB — URINALYSIS, ROUTINE W REFLEX MICROSCOPIC
Bilirubin Urine: NEGATIVE
Glucose, UA: NEGATIVE mg/dL
Hgb urine dipstick: NEGATIVE
Ketones, ur: 5 mg/dL — AB
Leukocytes,Ua: NEGATIVE
Nitrite: NEGATIVE
Protein, ur: NEGATIVE mg/dL
Specific Gravity, Urine: 1.008 (ref 1.005–1.030)
pH: 6 (ref 5.0–8.0)

## 2021-06-01 LAB — CBC
HCT: 30.5 % — ABNORMAL LOW (ref 36.0–46.0)
Hemoglobin: 10.3 g/dL — ABNORMAL LOW (ref 12.0–15.0)
MCH: 29.6 pg (ref 26.0–34.0)
MCHC: 33.8 g/dL (ref 30.0–36.0)
MCV: 87.6 fL (ref 80.0–100.0)
Platelets: 241 10*3/uL (ref 150–400)
RBC: 3.48 MIL/uL — ABNORMAL LOW (ref 3.87–5.11)
RDW: 12.1 % (ref 11.5–15.5)
WBC: 10.3 10*3/uL (ref 4.0–10.5)
nRBC: 0 % (ref 0.0–0.2)

## 2021-06-01 LAB — WET PREP, GENITAL
Clue Cells Wet Prep HPF POC: NONE SEEN
Sperm: NONE SEEN
Trich, Wet Prep: NONE SEEN
Yeast Wet Prep HPF POC: NONE SEEN

## 2021-06-01 MED ORDER — NIFEDIPINE 10 MG PO CAPS
10.0000 mg | ORAL_CAPSULE | ORAL | Status: AC
  Administered 2021-06-01 – 2021-06-02 (×4): 10 mg via ORAL
  Filled 2021-06-01 (×4): qty 1

## 2021-06-01 MED ORDER — LACTATED RINGERS IV BOLUS
1000.0000 mL | Freq: Once | INTRAVENOUS | Status: AC
  Administered 2021-06-01: 1000 mL via INTRAVENOUS

## 2021-06-01 NOTE — MAU Provider Note (Signed)
Chief Complaint:  Contractions   Event Date/Time   First Provider Initiated Contact with Patient 06/01/21 2304     HPI: Makayla Kaiser is a 35 y.o. G4P1002 at [redacted]w[redacted]d with Di/Di twins who presents to maternity admissions reporting lower abdominal cramping and contractions. Patient reports pain started last night and went away, however returned today around noon. Pain has progressively gotten closer together and more painful. Patient reports contractions are about every 3-5 mins and she describes the pain as "a severe period". She denies leaking fluid, vaginal bleeding, urinary s/s. Denies headache, vision changes, or RUQ/epigastric pain. Endorses active fetal movement.  Pregnancy Course:   Past Medical History:  Diagnosis Date   Asthma    Factor II deficiency (HCC)    History of DVT (deep vein thrombosis)    OB History  Gravida Para Term Preterm AB Living  4 2 1     2   SAB IAB Ectopic Multiple Live Births        0 2    # Outcome Date GA Lbr Len/2nd Weight Sex Delivery Anes PTL Lv  4 Current           3 Term 01/20/19 [redacted]w[redacted]d  2770 g F CS-LTranv Spinal  LIV  2 Gravida 2018    F CS-LTranv   LIV  1 Para            Past Surgical History:  Procedure Laterality Date   APPENDECTOMY     CESAREAN SECTION     CESAREAN SECTION N/A 01/20/2019   Procedure: CESAREAN SECTION;  Surgeon: 03/22/2019, MD;  Location: [facility] LD [facility]S;  Service: Obstetrics;  Laterality: N/A;  Repeat edc 02/02/19 NKDA Tracey RNFA   TONSILLECTOMY     Family History  Problem Relation Age of Onset   Factor V Leiden deficiency Mother    Hypothyroidism Mother    Heart disease Mother    Factor V Leiden deficiency Sister    Clotting disorder Sister    Social History   Tobacco Use   Smoking status: Never   Smokeless tobacco: Never  Vaping Use   Vaping Use: Never used  Substance Use Topics   Alcohol use: Not Currently   Drug use: Never   No Known Allergies Medications Prior to Admission  Medication Sig  Dispense Refill Last Dose   amphetamine-dextroamphetamine (ADDERALL) 10 MG tablet Take 10 mg by mouth daily with breakfast.   06/01/2021   cetirizine (ZYRTEC) 10 MG tablet Take 10 mg by mouth daily.   06/01/2021   enoxaparin (LOVENOX) 40 MG/0.4ML injection Inject 40 mg into the skin daily.   05/31/2021   famotidine (PEPCID) 20 MG tablet Take 20 mg by mouth 2 (two) times daily.   06/01/2021   Prenatal Vit-Fe Fumarate-FA (PRENATAL MULTIVITAMIN) TABS tablet Take 1 tablet by mouth daily at 12 noon.   06/01/2021   acetaminophen (TYLENOL) 500 MG tablet Take 500 mg by mouth every 6 (six) hours as needed for moderate pain.      albuterol (PROVENTIL HFA;VENTOLIN HFA) 108 (90 Base) MCG/ACT inhaler Inhale 1-2 puffs into the lungs every 6 (six) hours as needed for wheezing or shortness of breath.      ibuprofen (ADVIL,MOTRIN) 800 MG tablet Take 1 tablet (800 mg total) by mouth every 6 (six) hours as needed for moderate pain. 30 tablet 0    loratadine (CLARITIN) 10 MG tablet Take 10 mg by mouth daily.      oxyCODONE-acetaminophen (PERCOCET) 5-325 MG tablet Take 1-2 tablets by  mouth every 4 (four) hours as needed for severe pain. 20 tablet 0    I have reviewed patient's Past Medical Hx, Surgical Hx, Family Hx, Social Hx, medications and allergies.   ROS:  Review of Systems  Constitutional: Negative.   Respiratory: Negative.    Cardiovascular: Negative.   Gastrointestinal:  Positive for abdominal pain (cramping/contractions). Negative for diarrhea and vomiting.  Genitourinary: Negative.   Musculoskeletal: Negative.   Neurological: Negative.    Physical Exam  Patient Vitals for the past 24 hrs:  BP Temp Temp src Pulse Resp SpO2 Height Weight  06/02/21 0125 118/69 -- -- -- -- -- -- --  06/02/21 0103 (!) 117/59 -- -- -- -- -- -- --  06/02/21 0026 113/62 -- -- -- -- -- -- --  06/01/21 2352 119/69 -- -- -- -- -- -- --  06/01/21 2240 -- -- -- -- -- 99 % -- --  06/01/21 2230 -- -- -- -- -- 98 % -- --  06/01/21  2228 125/75 -- -- 93 -- -- -- --  06/01/21 2220 -- -- -- -- -- 98 % -- --  06/01/21 2219 -- -- -- -- -- 98 % -- --  06/01/21 2210 -- -- -- -- -- 98 % -- --  06/01/21 2209 -- -- -- -- -- 98 % -- --  06/01/21 2205 -- -- -- -- -- 98 % -- --  06/01/21 2200 -- -- -- -- -- 97 % -- --  06/01/21 2150 -- -- -- -- 18 97 % -- --  06/01/21 2145 134/76 -- -- 94 -- 97 % -- --  06/01/21 2140 -- -- -- -- -- 97 % -- --  06/01/21 2106 (!) 156/86 98.6 F (37 C) Oral 95 19 99 % 5\' 6"  (1.676 m) 108.9 kg   Constitutional: well-developed, well-nourished female in no acute distress.  Cardiovascular: normal rate Respiratory: normal effort GI: abd soft, non-tender, gravid  MS: extremities nontender, no edema, normal ROM Neurologic: alert and oriented x 4, no focal deficit  Pelvic: deferred, blind swabs obtained Cervix: closed/thick  FHT Baby A: Baseline 130 bpm, moderate variability, accelerations present, no decelerations FHT Baby B: Baseline 135 bpm, moderate variability, accelerations present, no decelerations Toco: q 4-5 mins   Labs: Results for orders placed or performed during the hospital encounter of 06/01/21 (from the past 24 hour(s))  Urinalysis, Routine w reflex microscopic Urine, Clean Catch     Status: Abnormal   Collection Time: 06/01/21  8:28 PM  Result Value Ref Range   Color, Urine STRAW (A) YELLOW   APPearance CLEAR CLEAR   Specific Gravity, Urine 1.008 1.005 - 1.030   pH 6.0 5.0 - 8.0   Glucose, UA NEGATIVE NEGATIVE mg/dL   Hgb urine dipstick NEGATIVE NEGATIVE   Bilirubin Urine NEGATIVE NEGATIVE   Ketones, ur 5 (A) NEGATIVE mg/dL   Protein, ur NEGATIVE NEGATIVE mg/dL   Nitrite NEGATIVE NEGATIVE   Leukocytes,Ua NEGATIVE NEGATIVE  Protein / creatinine ratio, urine     Status: None   Collection Time: 06/01/21  8:58 PM  Result Value Ref Range   Creatinine, Urine 58.27 mg/dL   Total Protein, Urine 8 mg/dL   Protein Creatinine Ratio 0.14 0.00 - 0.15 mg/mg[Cre]  CBC     Status:  Abnormal   Collection Time: 06/01/21 11:07 PM  Result Value Ref Range   WBC 10.3 4.0 - 10.5 K/uL   RBC 3.48 (L) 3.87 - 5.11 MIL/uL   Hemoglobin 10.3 (L) 12.0 - 15.0 g/dL  HCT 30.5 (L) 36.0 - 46.0 %   MCV 87.6 80.0 - 100.0 fL   MCH 29.6 26.0 - 34.0 pg   MCHC 33.8 30.0 - 36.0 g/dL   RDW 16.112.1 09.611.5 - 04.515.5 %   Platelets 241 150 - 400 K/uL   nRBC 0.0 0.0 - 0.2 %  Comprehensive metabolic panel     Status: Abnormal   Collection Time: 06/01/21 11:07 PM  Result Value Ref Range   Sodium 135 135 - 145 mmol/L   Potassium 3.8 3.5 - 5.1 mmol/L   Chloride 106 98 - 111 mmol/L   CO2 22 22 - 32 mmol/L   Glucose, Bld 83 70 - 99 mg/dL   BUN 7 6 - 20 mg/dL   Creatinine, Ser 4.090.64 0.44 - 1.00 mg/dL   Calcium 8.7 (L) 8.9 - 10.3 mg/dL   Total Protein 6.4 (L) 6.5 - 8.1 g/dL   Albumin 2.8 (L) 3.5 - 5.0 g/dL   AST 24 15 - 41 U/L   ALT 18 0 - 44 U/L   Alkaline Phosphatase 221 (H) 38 - 126 U/L   Total Bilirubin 0.5 0.3 - 1.2 mg/dL   GFR, Estimated >81>60 >19>60 mL/min   Anion gap 7 5 - 15  Wet prep, genital     Status: Abnormal   Collection Time: 06/01/21 11:36 PM   Specimen: Cervix  Result Value Ref Range   Yeast Wet Prep HPF POC NONE SEEN NONE SEEN   Trich, Wet Prep NONE SEEN NONE SEEN   Clue Cells Wet Prep HPF POC NONE SEEN NONE SEEN   WBC, Wet Prep HPF POC MODERATE (A) NONE SEEN   Sperm NONE SEEN   Fetal fibronectin     Status: None   Collection Time: 06/01/21 11:36 PM  Result Value Ref Range   Fetal Fibronectin NEGATIVE NEGATIVE    Imaging:  No results found.  MAU Course: Orders Placed This Encounter  Procedures   Wet prep, genital   Urinalysis, Routine w reflex microscopic Urine, Clean Catch   CBC   Comprehensive metabolic panel   Protein / creatinine ratio, urine   Fetal fibronectin   Discharge patient   Meds ordered this encounter  Medications   lactated ringers bolus 1,000 mL   NIFEdipine (PROCARDIA) capsule 10 mg    MDM: UA unremarkable CBC, CMP, UPCR unremarkable FFN  negative Wet prep, GC/CT collected IVF bolus Procardia series for preterm contractions Serial BP's. 1 elevated BP, others normotensive. Patient asymptomatic Cervix closed/thick, remains unchanged after 2 hours. Patient reports improvement in contraction frequency and intensity after IVF bolus and procardia NST reactive x2.  Recommend increase PO hydration, frequent rest periods, heat/ice prn.    Assessment: 1. [redacted] weeks gestation of pregnancy   2. Preterm contractions     Plan: Discharge home in stable condition Keep OB appointment as scheduled on 06/06/2021 Preterm labor precautions and fetal kick counts reviewed Return to MAU as needed    Follow-up Information     [place] Follow up.   Why: as scheduled on 06/06/2021. Return to MAU as needed. Contact information: [address] [place] [id number] [phone]                 Allergies as of 06/02/2021   No Known Allergies      Medication List     STOP taking these medications    amphetamine-dextroamphetamine 10 MG tablet Commonly known as: ADDERALL   ibuprofen 800 MG tablet  Commonly known as: ADVIL   oxyCODONE-acetaminophen 5-325 MG tablet Commonly known as: Percocet       TAKE these medications    acetaminophen 500 MG tablet Commonly known as: TYLENOL Take 500 mg by mouth every 6 (six) hours as needed for moderate pain.   albuterol 108 (90 Base) MCG/ACT inhaler Commonly known as: VENTOLIN HFA Inhale 1-2 puffs into the lungs every 6 (six) hours as needed for wheezing or shortness of breath.   cetirizine 10 MG tablet Commonly known as: ZYRTEC Take 10 mg by mouth daily.   enoxaparin 40 MG/0.4ML injection Commonly known as: LOVENOX Inject 40 mg into the skin daily.   famotidine 20 MG tablet Commonly known as: PEPCID Take 20 mg by mouth 2 (two) times daily.   loratadine 10 MG tablet Commonly known as: CLARITIN Take 10 mg by mouth daily.    prenatal multivitamin Tabs tablet Take 1 tablet by mouth daily at 12 noon.         Camelia Eng, MSN, CNM 06/02/2021 1:57 AM

## 2021-06-01 NOTE — MAU Note (Signed)
Pt reports she has been having contractions since 1pm today. Denies bleeding or ROM

## 2021-06-02 LAB — PROTEIN / CREATININE RATIO, URINE
Creatinine, Urine: 58.27 mg/dL
Protein Creatinine Ratio: 0.14 mg/mg{Cre} (ref 0.00–0.15)
Total Protein, Urine: 8 mg/dL

## 2021-06-02 LAB — COMPREHENSIVE METABOLIC PANEL
ALT: 18 U/L (ref 0–44)
AST: 24 U/L (ref 15–41)
Albumin: 2.8 g/dL — ABNORMAL LOW (ref 3.5–5.0)
Alkaline Phosphatase: 221 U/L — ABNORMAL HIGH (ref 38–126)
Anion gap: 7 (ref 5–15)
BUN: 7 mg/dL (ref 6–20)
CO2: 22 mmol/L (ref 22–32)
Calcium: 8.7 mg/dL — ABNORMAL LOW (ref 8.9–10.3)
Chloride: 106 mmol/L (ref 98–111)
Creatinine, Ser: 0.64 mg/dL (ref 0.44–1.00)
GFR, Estimated: >60 mL/min (ref >60)
Glucose, Bld: 83 mg/dL (ref 70–99)
Potassium: 3.8 mmol/L (ref 3.5–5.1)
Sodium: 135 mmol/L (ref 135–145)
Total Bilirubin: 0.5 mg/dL (ref 0.3–1.2)
Total Protein: 6.4 g/dL — ABNORMAL LOW (ref 6.5–8.1)

## 2021-06-02 LAB — FETAL FIBRONECTIN: Fetal Fibronectin: NEGATIVE

## 2021-06-04 LAB — GC/CHLAMYDIA PROBE AMP (~~LOC~~) NOT AT ARMC
Chlamydia: NEGATIVE
Comment: NEGATIVE
Comment: NORMAL
Neisseria Gonorrhea: NEGATIVE

## 2021-06-12 ENCOUNTER — Other Ambulatory Visit: Payer: Self-pay

## 2021-06-12 ENCOUNTER — Encounter (HOSPITAL_COMMUNITY): Payer: Self-pay | Admitting: Obstetrics and Gynecology

## 2021-06-12 ENCOUNTER — Inpatient Hospital Stay (HOSPITAL_BASED_OUTPATIENT_CLINIC_OR_DEPARTMENT_OTHER): Payer: Commercial Managed Care - PPO

## 2021-06-12 ENCOUNTER — Observation Stay (HOSPITAL_COMMUNITY)
Admission: AD | Admit: 2021-06-12 | Discharge: 2021-06-13 | Disposition: A | Discharge status: Home / Self Care | Payer: Commercial Managed Care - PPO | Attending: Obstetrics and Gynecology | Admitting: Obstetrics and Gynecology

## 2021-06-12 DIAGNOSIS — O26893 Other specified pregnancy related conditions, third trimester: Secondary | ICD-10-CM

## 2021-06-12 DIAGNOSIS — O99513 Diseases of the respiratory system complicating pregnancy, third trimester: Secondary | ICD-10-CM | POA: Insufficient documentation

## 2021-06-12 DIAGNOSIS — Z3689 Encounter for other specified antenatal screening: Secondary | ICD-10-CM

## 2021-06-12 DIAGNOSIS — O26899 Other specified pregnancy related conditions, unspecified trimester: Secondary | ICD-10-CM

## 2021-06-12 DIAGNOSIS — R1032 Left lower quadrant pain: Secondary | ICD-10-CM | POA: Diagnosis not present

## 2021-06-12 DIAGNOSIS — O30043 Twin pregnancy, dichorionic/diamniotic, third trimester: Secondary | ICD-10-CM | POA: Insufficient documentation

## 2021-06-12 DIAGNOSIS — O30009 Twin pregnancy, unspecified number of placenta and unspecified number of amniotic sacs, unspecified trimester: Secondary | ICD-10-CM

## 2021-06-12 DIAGNOSIS — J45909 Unspecified asthma, uncomplicated: Secondary | ICD-10-CM | POA: Insufficient documentation

## 2021-06-12 DIAGNOSIS — D682 Hereditary deficiency of other clotting factors: Secondary | ICD-10-CM

## 2021-06-12 DIAGNOSIS — O34593 Maternal care for other abnormalities of gravid uterus, third trimester: Secondary | ICD-10-CM | POA: Diagnosis not present

## 2021-06-12 DIAGNOSIS — Z3A32 32 weeks gestation of pregnancy: Secondary | ICD-10-CM

## 2021-06-12 DIAGNOSIS — O321XX1 Maternal care for breech presentation, fetus 1: Secondary | ICD-10-CM

## 2021-06-12 DIAGNOSIS — O368330 Maternal care for abnormalities of the fetal heart rate or rhythm, third trimester, not applicable or unspecified: Secondary | ICD-10-CM | POA: Diagnosis not present

## 2021-06-12 DIAGNOSIS — O321XX2 Maternal care for breech presentation, fetus 2: Secondary | ICD-10-CM

## 2021-06-12 DIAGNOSIS — Z20822 Contact with and (suspected) exposure to covid-19: Secondary | ICD-10-CM | POA: Diagnosis not present

## 2021-06-12 DIAGNOSIS — R109 Unspecified abdominal pain: Secondary | ICD-10-CM | POA: Diagnosis not present

## 2021-06-12 DIAGNOSIS — O09523 Supervision of elderly multigravida, third trimester: Secondary | ICD-10-CM

## 2021-06-12 DIAGNOSIS — O0990 Supervision of high risk pregnancy, unspecified, unspecified trimester: Secondary | ICD-10-CM

## 2021-06-12 LAB — CBC WITH DIFFERENTIAL/PLATELET
Abs Immature Granulocytes: 0.09 10*3/uL — ABNORMAL HIGH (ref 0.00–0.07)
Basophils Absolute: 0 10*3/uL (ref 0.0–0.1)
Basophils Relative: 0 %
Eosinophils Absolute: 0.1 10*3/uL (ref 0.0–0.5)
Eosinophils Relative: 1 %
HCT: 32.4 % — ABNORMAL LOW (ref 36.0–46.0)
Hemoglobin: 10.8 g/dL — ABNORMAL LOW (ref 12.0–15.0)
Immature Granulocytes: 1 %
Lymphocytes Relative: 20 %
Lymphs Abs: 1.8 10*3/uL (ref 0.7–4.0)
MCH: 29 pg (ref 26.0–34.0)
MCHC: 33.3 g/dL (ref 30.0–36.0)
MCV: 87.1 fL (ref 80.0–100.0)
Monocytes Absolute: 0.7 10*3/uL (ref 0.1–1.0)
Monocytes Relative: 8 %
Neutro Abs: 6.4 10*3/uL (ref 1.7–7.7)
Neutrophils Relative %: 70 %
Platelets: 248 10*3/uL (ref 150–400)
RBC: 3.72 MIL/uL — ABNORMAL LOW (ref 3.87–5.11)
RDW: 12.2 % (ref 11.5–15.5)
WBC: 9.1 10*3/uL (ref 4.0–10.5)
nRBC: 0 % (ref 0.0–0.2)

## 2021-06-12 LAB — PROTEIN / CREATININE RATIO, URINE
Creatinine, Urine: 145.86 mg/dL
Protein Creatinine Ratio: 0.18 mg/mg{Cre} — ABNORMAL HIGH (ref 0.00–0.15)
Total Protein, Urine: 26 mg/dL

## 2021-06-12 LAB — COMPREHENSIVE METABOLIC PANEL
ALT: 20 U/L (ref 0–44)
AST: 20 U/L (ref 15–41)
Albumin: 2.7 g/dL — ABNORMAL LOW (ref 3.5–5.0)
Alkaline Phosphatase: 334 U/L — ABNORMAL HIGH (ref 38–126)
Anion gap: 10 (ref 5–15)
BUN: 8 mg/dL (ref 6–20)
CO2: 21 mmol/L — ABNORMAL LOW (ref 22–32)
Calcium: 8.9 mg/dL (ref 8.9–10.3)
Chloride: 104 mmol/L (ref 98–111)
Creatinine, Ser: 0.67 mg/dL (ref 0.44–1.00)
GFR, Estimated: >60 mL/min (ref >60)
Glucose, Bld: 89 mg/dL (ref 70–99)
Potassium: 3.5 mmol/L (ref 3.5–5.1)
Sodium: 135 mmol/L (ref 135–145)
Total Bilirubin: 0.4 mg/dL (ref 0.3–1.2)
Total Protein: 6.4 g/dL — ABNORMAL LOW (ref 6.5–8.1)

## 2021-06-12 LAB — URINALYSIS, ROUTINE W REFLEX MICROSCOPIC
Bilirubin Urine: NEGATIVE
Glucose, UA: NEGATIVE mg/dL
Hgb urine dipstick: NEGATIVE
Ketones, ur: 5 mg/dL — AB
Nitrite: NEGATIVE
Protein, ur: NEGATIVE mg/dL
Specific Gravity, Urine: 1.018 (ref 1.005–1.030)
Squamous Epithelial / HPF: >50 — ABNORMAL HIGH (ref 0–5)
pH: 5 (ref 5.0–8.0)

## 2021-06-12 LAB — RESP PANEL BY RT-PCR (FLU A&B, COVID) ARPGX2
Influenza A by PCR: NEGATIVE
Influenza B by PCR: NEGATIVE
SARS Coronavirus 2 by RT PCR: NEGATIVE

## 2021-06-12 LAB — TYPE AND SCREEN
ABO/RH(D): B POS
Antibody Screen: NEGATIVE

## 2021-06-12 MED ORDER — BETAMETHASONE SOD PHOS & ACET 6 (3-3) MG/ML IJ SUSP
12.0000 mg | INTRAMUSCULAR | Status: AC
  Administered 2021-06-12 – 2021-06-13 (×2): 12 mg via INTRAMUSCULAR
  Filled 2021-06-12: qty 5

## 2021-06-12 MED ORDER — DOCUSATE SODIUM 100 MG PO CAPS
100.0000 mg | ORAL_CAPSULE | Freq: Every day | ORAL | Status: DC
  Filled 2021-06-12: qty 1

## 2021-06-12 MED ORDER — CALCIUM CARBONATE ANTACID 500 MG PO CHEW
2.0000 | CHEWABLE_TABLET | ORAL | Status: DC | PRN
  Administered 2021-06-13 (×2): 400 mg via ORAL
  Filled 2021-06-12 (×2): qty 2

## 2021-06-12 MED ORDER — ACETAMINOPHEN 325 MG PO TABS
650.0000 mg | ORAL_TABLET | ORAL | Status: DC | PRN
  Administered 2021-06-12 – 2021-06-13 (×2): 650 mg via ORAL
  Filled 2021-06-12 (×2): qty 2

## 2021-06-12 MED ORDER — ZOLPIDEM TARTRATE 5 MG PO TABS: 5.0000 mg | ORAL_TABLET | Freq: Every evening | ORAL | Status: DC | PRN

## 2021-06-12 MED ORDER — CYCLOBENZAPRINE HCL 5 MG PO TABS
10.0000 mg | ORAL_TABLET | Freq: Once | ORAL | Status: AC
  Administered 2021-06-12: 10 mg via ORAL
  Filled 2021-06-12: qty 2

## 2021-06-12 MED ORDER — PRENATAL MULTIVITAMIN CH
1.0000 | ORAL_TABLET | Freq: Every day | ORAL | Status: DC
  Administered 2021-06-13: 1 via ORAL
  Filled 2021-06-12: qty 1

## 2021-06-12 MED ORDER — LACTATED RINGERS IV SOLN: INTRAVENOUS | Status: DC

## 2021-06-12 MED ORDER — CYCLOBENZAPRINE HCL 10 MG PO TABS
10.0000 mg | ORAL_TABLET | Freq: Three times a day (TID) | ORAL | Status: DC
  Administered 2021-06-13 (×2): 10 mg via ORAL
  Filled 2021-06-12 (×2): qty 1

## 2021-06-12 NOTE — MAU Provider Note (Addendum)
History     CSN: [id number]  Arrival date and time: 06/12/21 1512   Event Date/Time   First Provider Initiated Contact with Patient 06/12/21 1616      Chief Complaint  Patient presents with   Abdominal Pain    (L) sided abdominal pain that has radiated to the right   Ms. Makayla Kaiser is a 35 y.o. G4P1002 at [redacted]w[redacted]d who presents to MAU for abdominal pain. Patient reports she had intermittent abdominal pain yesterday on about 3-4 occasions, but it would last about 45-37min, but would go away with rest. Patient reports the pain was sharp if moving, but only a tight cramping at rest. Patient reports this made it difficult to walk. Patient reports since 10/1030AM this morning the pain came back and was constant and describes the same type of pain as above. Patient reports she took  of Tylenol around 2PM, which helped some, but not significantly and especially not while moving around. Patient rates pain as 6/10 and 10/10 when moving. Patient denies any trauma or falls.  Patient has di-di twin pregnancy.  Pt denies VB, LOF, ctx, decreased FM, vaginal discharge/odor/itching. Pt denies N/V, constipation, diarrhea, or urinary problems. Pt denies fever, chills, fatigue, sweating or changes in appetite. Pt denies SOB or chest pain. Pt denies dizziness, HA, light-headedness, weakness.  Problems this pregnancy include: borderline IUGR (11-12%). Allergies? NKDA Current medications/supplements? Zyrtec, famotidine, Tylenol, PNV, lovenox (last took last night) Prenatal care provider? [facility], next appt 06/14/2021   OB History     Gravida  4   Para  2   Term  1   Preterm      AB      Living  2      SAB      IAB      Ectopic      Multiple  0   Live Births  2           Past Medical History:  Diagnosis Date   Asthma    Factor II deficiency (HCC)    History of DVT (deep vein thrombosis)     Past Surgical History:  Procedure Laterality Date    APPENDECTOMY     CESAREAN SECTION     CESAREAN SECTION N/A 01/20/2019   Procedure: CESAREAN SECTION;  Surgeon: Harold Hedge, MD;  Location: [facility];  Service: Obstetrics;  Laterality: N/A;  Repeat edc 02/02/19 NKDA Tracey RNFA   TONSILLECTOMY      Family History  Problem Relation Age of Onset   Factor V Leiden deficiency Mother    Hypothyroidism Mother    Heart disease Mother    Factor V Leiden deficiency Sister    Clotting disorder Sister     Social History   Tobacco Use   Smoking status: Never   Smokeless tobacco: Never  Vaping Use   Vaping Use: Never used  Substance Use Topics   Alcohol use: Not Currently   Drug use: Never    Allergies: No Known Allergies  Medications Prior to Admission  Medication Sig Dispense Refill Last Dose   acetaminophen (TYLENOL) 500 MG tablet Take 500 mg by mouth every 6 (six) hours as needed for moderate pain.   06/12/2021   cetirizine (ZYRTEC) 10 MG tablet Take 10 mg by mouth daily.   06/12/2021   enoxaparin (LOVENOX) 40 MG/0.4ML injection Inject 40 mg into the skin daily.   06/11/2021   famotidine (PEPCID) 20 MG tablet Take 20 mg by mouth 2 (  two) times daily.   06/12/2021   loratadine (CLARITIN) 10 MG tablet Take 10 mg by mouth daily.   06/12/2021   Prenatal Vit-Fe Fumarate-FA (PRENATAL MULTIVITAMIN) TABS tablet Take 1 tablet by mouth daily at 12 noon.   06/12/2021   albuterol (PROVENTIL HFA;VENTOLIN HFA) 108 (90 Base) MCG/ACT inhaler Inhale 1-2 puffs into the lungs every 6 (six) hours as needed for wheezing or shortness of breath.   More than a month    Review of Systems  Constitutional:  Negative for chills, diaphoresis, fatigue and fever.  Eyes:  Negative for visual disturbance.  Respiratory:  Negative for shortness of breath.   Cardiovascular:  Negative for chest pain.  Gastrointestinal:  Positive for abdominal pain. Negative for constipation, diarrhea, nausea and vomiting.  Genitourinary:  Negative for dysuria, flank pain, frequency, pelvic  pain, urgency, vaginal bleeding and vaginal discharge.  Neurological:  Negative for dizziness, weakness, light-headedness and headaches.   Physical Exam   Blood pressure 118/66, pulse 86, temperature 98.3 F (36.8 C), temperature source Oral, resp. rate 16, height 5\' 6"  (1.676 m), weight 109.7 kg, SpO2 97 %.  Patient Vitals for the past 24 hrs:  BP Temp Temp src Pulse Resp SpO2 Height Weight  06/12/21 1915 118/66 -- -- 86 -- -- -- --  06/12/21 1845 114/60 -- -- 80 -- -- -- --  06/12/21 1832 123/68 -- -- 80 -- -- -- --  06/12/21 1650 -- -- -- -- -- 97 % -- --  06/12/21 1645 (!) 102/59 -- -- 87 -- 97 % -- --  06/12/21 1640 -- -- -- -- -- 96 % -- --  06/12/21 1637 115/69 -- -- 93 -- -- -- --  06/12/21 1635 -- -- -- -- -- 98 % -- --  06/12/21 1630 -- -- -- -- -- 98 % -- --  06/12/21 1625 -- -- -- -- -- 98 % -- --  06/12/21 1620 -- -- -- -- -- 98 % -- --  06/12/21 1615 -- -- -- -- -- 98 % -- --  06/12/21 1610 -- -- -- -- -- 99 % -- --  06/12/21 1605 -- -- -- -- -- 98 % -- --  06/12/21 1601 (!) 143/80 98.3 F (36.8 C) Oral 99 16 98 % 5\' 6"  (1.676 m) 109.7 kg  06/12/21 1600 -- -- -- -- -- 98 % -- --   Physical Exam Vitals and nursing note reviewed.  Constitutional:      General: She is not in acute distress.    Appearance: Normal appearance. She is not ill-appearing, toxic-appearing or diaphoretic.  HENT:     Head: Normocephalic and atraumatic.  Pulmonary:     Effort: Pulmonary effort is normal.  Abdominal:     Tenderness: There is abdominal tenderness in the left lower quadrant. There is no guarding or rebound.  Skin:    General: Skin is warm and dry.  Neurological:     Mental Status: She is alert and oriented to person, place, and time.  Psychiatric:        Mood and Affect: Mood normal.        Behavior: Behavior normal.        Thought Content: Thought content normal.        Judgment: Judgment normal.   Results for orders placed or performed during the hospital encounter of  06/12/21 (from the past 24 hour(s))  Urinalysis, Routine w reflex microscopic Urine, Clean Catch     Status: Abnormal   Collection Time:  06/12/21  4:10 PM  Result Value Ref Range   Color, Urine YELLOW YELLOW   APPearance CLOUDY (A) CLEAR   Specific Gravity, Urine 1.018 1.005 - 1.030   pH 5.0 5.0 - 8.0   Glucose, UA NEGATIVE NEGATIVE mg/dL   Hgb urine dipstick NEGATIVE NEGATIVE   Bilirubin Urine NEGATIVE NEGATIVE   Ketones, ur 5 (A) NEGATIVE mg/dL   Protein, ur NEGATIVE NEGATIVE mg/dL   Nitrite NEGATIVE NEGATIVE   Leukocytes,Ua SMALL (A) NEGATIVE   RBC / HPF 0-5 0 - 5 RBC/hpf   WBC, UA 11-20 0 - 5 WBC/hpf   Bacteria, UA RARE (A) NONE SEEN   Squamous Epithelial / LPF >50 (H) 0 - 5   Mucus PRESENT   Protein / creatinine ratio, urine     Status: Abnormal   Collection Time: 06/12/21  4:26 PM  Result Value Ref Range   Creatinine, Urine 145.86 mg/dL   Total Protein, Urine 26 mg/dL   Protein Creatinine Ratio 0.18 (H) 0.00 - 0.15 mg/mg[Cre]  CBC with Differential/Platelet     Status: Abnormal   Collection Time: 06/12/21  4:50 PM  Result Value Ref Range   WBC 9.1 4.0 - 10.5 K/uL   RBC 3.72 (L) 3.87 - 5.11 MIL/uL   Hemoglobin 10.8 (L) 12.0 - 15.0 g/dL   HCT 16.132.4 (L) 09.636.0 - 04.546.0 %   MCV 87.1 80.0 - 100.0 fL   MCH 29.0 26.0 - 34.0 pg   MCHC 33.3 30.0 - 36.0 g/dL   RDW 40.912.2 81.111.5 - 91.415.5 %   Platelets 248 150 - 400 K/uL   nRBC 0.0 0.0 - 0.2 %   Neutrophils Relative % 70 %   Neutro Abs 6.4 1.7 - 7.7 K/uL   Lymphocytes Relative 20 %   Lymphs Abs 1.8 0.7 - 4.0 K/uL   Monocytes Relative 8 %   Monocytes Absolute 0.7 0.1 - 1.0 K/uL   Eosinophils Relative 1 %   Eosinophils Absolute 0.1 0.0 - 0.5 K/uL   Basophils Relative 0 %   Basophils Absolute 0.0 0.0 - 0.1 K/uL   Immature Granulocytes 1 %   Abs Immature Granulocytes 0.09 (H) 0.00 - 0.07 K/uL  Comprehensive metabolic panel     Status: Abnormal   Collection Time: 06/12/21  4:50 PM  Result Value Ref Range   Sodium 135 135 - 145 mmol/L    Potassium 3.5 3.5 - 5.1 mmol/L   Chloride 104 98 - 111 mmol/L   CO2 21 (L) 22 - 32 mmol/L   Glucose, Bld 89 70 - 99 mg/dL   BUN 8 6 - 20 mg/dL   Creatinine, Ser 7.820.67 0.44 - 1.00 mg/dL   Calcium 8.9 8.9 - 95.610.3 mg/dL   Total Protein 6.4 (L) 6.5 - 8.1 g/dL   Albumin 2.7 (L) 3.5 - 5.0 g/dL   AST 20 15 - 41 U/L   ALT 20 0 - 44 U/L   Alkaline Phosphatase 334 (H) 38 - 126 U/L   Total Bilirubin 0.4 0.3 - 1.2 mg/dL   GFR, Estimated >21>60 >30>60 mL/min   Anion gap 10 5 - 15   US MFM OB LIMITED  Result Date: 06/12/2021 ----------------------------------------------------------------------  OBSTETRICS REPORT                       (Signed Final 06/12/2021 06:03 pm) ---------------------------------------------------------------------- Patient Info  ID #:       [id number]  D.O.B.:  Nov 24, 1985 (35 yrs)  Name:       Makayla Kaiser [facility]           Visit Date: 06/12/2021 04:53 pm ---------------------------------------------------------------------- Performed By  Attending:        Ma Rings MD         Referred By:      Marylen Ponto  Performed By:     Reinaldo Raddle            [facility] ---------------------------------------------------------------------- Orders  #  Description                           Code        Ordered By  1  [place] MFM OB LIMITED                     91478.29    Machai Desmith ----------------------------------------------------------------------  #  Order #                     Accession #                Episode #  1  [id number]                   [id number]                 [id number] ---------------------------------------------------------------------- Indications  Abdominal pain in pregnancy                    O99.89  Twin pregnancy, di/di, third trimester         O30.043  Medical complication of pregnancy (uterine     O26.90  window)  Advanced maternal age multigravida 50+,         O9.523  third trimester (37)  [redacted] weeks gestation of pregnancy                Z3A.32 ---------------------------------------------------------------------- Fetal Evaluation (Fetus A)  Num Of Fetuses:         2  Fetal Heart Rate(bpm):  145  Cardiac Activity:       Observed  Fetal Lie:              Mt Left Lower Fetus  Presentation:           Breech  Placenta:               Posterior  Membrane Desc:      Dividing Membrane seen  Amniotic Fluid  AFI FV:      Within normal limits                              Largest Pocket(cm)                              3.2  Comment:    Placenta observed with color flow around perimeter, no echoes  visualized near cervical area. ---------------------------------------------------------------------- Biometry (Fetus A)  LV:        3.7  mm ---------------------------------------------------------------------- Gestational Age (Fetus A)  Clinical EDD:  32w 5d                                        EDD:   08/02/21  Best:          32w 5d     Det. By:  Clinical EDD             EDD:   08/02/21 ---------------------------------------------------------------------- Anatomy (Fetus A)  Ventricles:            Appears normal         Kidneys:                Appear normal  Diaphragm:             Appears normal         Bladder:                Appears normal  Stomach:               Appears normal, left                         sided ---------------------------------------------------------------------- Fetal Evaluation (Fetus B)  Num Of Fetuses:         2  Fetal Heart Rate(bpm):  138  Cardiac Activity:       Observed  Fetal Lie:              Maternal left side upper  Presentation:           Breech  Placenta:               Anterior  Membrane Desc:      Dividing Membrane seen  Amniotic Fluid  AFI FV:      Within normal limits                              Largest Pocket(cm)                              2.6  Comment:    Placenta observed with good color flow  ---------------------------------------------------------------------- Biometry (Fetus B)  LV:        3.3  mm ---------------------------------------------------------------------- Gestational Age (Fetus B)  Clinical EDD:  32w 5d                                        EDD:   08/02/21  Best:          32w 5d     Det. By:  Clinical EDD             EDD:   08/02/21 ---------------------------------------------------------------------- Anatomy (Fetus B)  Ventricles:            Appears normal         Kidneys:                Appear normal  Diaphragm:             Appears normal  Bladder:                Appears normal  Stomach:               Appears normal, left                         sided ---------------------------------------------------------------------- Cervix Uterus Adnexa  Cervix  Not visualized (advanced GA >24wks) ---------------------------------------------------------------------- Comments  This patient presented to the MAU due to abdominal pain and  a dichorionic, diamniotic twin gestation.  Twin A: Breech presentation.  Normal-appearing anterior  placenta.  Twin B: Breech presentation.  Normal-appearing anterior  placenta.  There was normal amniotic fluid noted around both fetuses. ----------------------------------------------------------------------                   Ma Rings, MD Electronically Signed Final Report   06/12/2021 06:03 pm ----------------------------------------------------------------------   MAU Course  Procedures  MDM -abdominal pain in pregnancy, constant since 10/1030AM, little relief with Tylenol -di-di twins -AMA -hx uterine window with previous C/S -[place]: A: breech, posterior placenta, AFI WNL, no echoes visualized on placenta near cervical area; B: breech, anterior placenta, AFI WNL -pre-e labs performed d/t elevated blood pressure on admission -UA: cloudy/5 ketones/sm leuks/rare bacteria, sending urine for culture -CBC: WNL for pregnancy -CMP: WNL for  pregnancy -PCr: 0.18 -after Flexeril, pt rates pain as 2/10 when not moving, 6/10 when moving, but one hour later, pain has returned, Flexeril administered at 5PM, pain returned 2 hours later at same levels as on admission to MAU. -EFM (Twin A): initially reactive, then non-reactive       -baseline: 135       -variability: moderate       -accels: 15x15 initially       -decels: absent       -TOCO: scant ctx -EFM (Twin B): reactive, then non-reactive       -baseline: 135       -variability: moderate       -accels: present, 15x15       -decels: absent       -TOCO: scant ctx -consulted with Dr. Jolayne Panther, who recommends consultation with MFM -consulted with Dr. Parke Poisson who recommends betamethasone and admission to [facility] -called Dr. Lorane Gell to recommend admission per MFM, Dr. Lorane Gell agrees with plan and will enter orders for admission and betamethasone -order placed for in-patient MFM Consultation -admit to [facility]  Orders Placed This Encounter  Procedures   Culture, OB Urine    Standing Status:   Standing    Number of Occurrences:   1   [place] MFM OB LIMITED    Patient has uterine window.    Standing Status:   Standing    Number of Occurrences:   1    Order Specific Question:   Symptom/Reason for Exam    Answer:   Abdominal pain in pregnancy [161096]   Urinalysis, Routine w reflex microscopic Urine, Clean Catch    Standing Status:   Standing    Number of Occurrences:   1   CBC with Differential/Platelet    Standing Status:   Standing    Number of Occurrences:   1   Comprehensive metabolic panel    Standing Status:   Standing    Number of Occurrences:   1   Protein / creatinine ratio, urine    Standing Status:   Standing    Number of Occurrences:   1   Diet NPO time specified  Standing Status:   Standing    Number of Occurrences:   1   Notify physician (specify)    Standing Status:   Standing    Number of Occurrences:   20    Order Specific Question:   Notify Physician    Answer:    for pulse less than 60 or greater than 120    Order Specific Question:   Notify Physician    Answer:   for respiratory rate less than 12 or greater than 28    Order Specific Question:   Notify Physician    Answer:   for temperature greater than 100.4    Order Specific Question:   Notify Physician    Answer:   for urinary output less than 30 ml/hr    Order Specific Question:   Notify Physician    Answer:   for systolic BP less than 80 or greater than 140    Order Specific Question:   Notify Physician    Answer:   for diastolic BP less than 40 or greater than 90   Vital signs    While awake, respect sleep.    Standing Status:   Standing    Number of Occurrences:   1   Defer vaginal exam for vaginal bleeding or PROM <37 weeks    Standing Status:   Standing    Number of Occurrences:   1   Initiate Oral Care Protocol    Standing Status:   Standing    Number of Occurrences:   1   Initiate Carrier Fluid Protocol    Standing Status:   Standing    Number of Occurrences:   1   SCDs    Standing Status:   Standing    Number of Occurrences:   1    Order Specific Question:   Laterality    Answer:   Bilateral   Fetal monitoring    Standing Status:   Standing    Number of Occurrences:   1   Continuous tocometry    Standing Status:   Standing    Number of Occurrences:   1   Activity as tolerated    Standing Status:   Standing    Number of Occurrences:   1   Full code    Standing Status:   Standing    Number of Occurrences:   1   Consult to Maternal Fetal Care    Standing Status:   Standing    Number of Occurrences:   1    Order Specific Question:   Date Requested    Answer:   06/13/2021    Order Specific Question:   Number of Fetus    Answer:   Two    Order Specific Question:   Consult Type:    Answer:   [facility] Physician Bedside Consult    Order Specific Question:   Indication for Consult    Answer:   uterine window   Type and screen [facility]    [facility]     Standing Status:   Standing    Number of Occurrences:   1   Place in observation (patient's expected length of stay will be less than 2 midnights)    Standing Status:   Standing    Number of Occurrences:   1    Order Specific Question:   Hospital Area    Answer:   [facility] [[id number]]    Order Specific  Question:   Level of Care    Answer:   Antepartum [20]    Order Specific Question:   Covid Evaluation    Answer:   Asymptomatic Screening Protocol (No Symptoms)    Order Specific Question:   Diagnosis    Answer:   High-risk pregnancy [193790]    Order Specific Question:   Admitting Physician    Answer:   Lyn Henri [[id number]]    Order Specific Question:   Attending Physician    Answer:   Lyn Henri [[id number]]   Meds ordered this encounter  Medications   cyclobenzaprine (FLEXERIL) tablet 10 mg   acetaminophen (TYLENOL) tablet 650 mg   zolpidem (AMBIEN) tablet 5 mg   docusate sodium (COLACE) capsule 100 mg   calcium carbonate (TUMS - dosed in mg elemental calcium) chewable tablet 400 mg of elemental calcium   prenatal multivitamin tablet 1 tablet   betamethasone acetate-betamethasone sodium phosphate (CELESTONE) injection 12 mg   cyclobenzaprine (FLEXERIL) tablet 10 mg   lactated ringers infusion   Assessment and Plan   1. LLQ abdominal pain   2. Abdominal pain in pregnancy   3. [redacted] weeks gestation of pregnancy   4. Dichorionic diamniotic twin pregnancy in third trimester   5. NST (non-stress test) reactive    -admit to [facility] for observation  Odie Sera Keyshon Stein 06/12/2021, 8:04 PM

## 2021-06-12 NOTE — MAU Note (Signed)
Patient arrived to MAU  instructed by Dr, Velvet Bathe from [facility]  to come to MAU for complaint of severe lower Left abdominal pain that is 10/10 when she is moving. 6/10 when at rest. Patient stated that the pain has started occasional on the right. She stated that she denies it is contractions but constant pain. Patient has a uterine window from previous pregnancy and clotting disorder. Positive fetal movement reported. She denies vaginal bleeding and LOF. EFM commenced

## 2021-06-12 NOTE — H&P (Addendum)
Antepartum History and Physical   Makayla Kaiser is a 35 y.o. female [phone] that presented for lower abdominal pain, both sides, lateral.  Her pain is positional, closely related to movement or staying in the same position for prolonged period. She denies contractions, LOF, VB. She reports good FM. She was previously treated successfully for threatened preterm labor and reports this is distinctly different.  Pregnancy history is notable for AMA, Di-Di twins, history of prior C section x 2 and uterine window noted, Factor II deficiency (with clot history while on OCP) currently on 40 mg Perry lovenox QHS.  Her last lovenox dose was last night.  Most recent US in office 05/23/21: Twin A- [redacted]w[redacted]d**Breech**Normal Fluid**EFW= 1412g (3'2oz)= 11 %tile**BPP: 8/8**Twin B- [redacted]w[redacted]d**Transverse**Normal Fluid**EFW= 1491g (3'5oz)= 20 %tile**BPP: 8/8  OB History     Gravida  4   Para  2   Term  1   Preterm      AB      Living  2      SAB      IAB      Ectopic      Multiple  0   Live Births  2          Past Medical History:  Diagnosis Date   Asthma    Factor II deficiency (HCC)    History of DVT (deep vein thrombosis)    Past Surgical History:  Procedure Laterality Date   APPENDECTOMY     CESAREAN SECTION     CESAREAN SECTION N/A 01/20/2019   Procedure: CESAREAN SECTION;  Surgeon: Harold Hedge, MD;  Location: [facility] LD [facility];  Service: Obstetrics;  Laterality: N/A;  Repeat edc 02/02/19 NKDA Tracey RNFA   TONSILLECTOMY     Family History: family history includes Clotting disorder in her sister; Factor V Leiden deficiency in her mother and sister; Heart disease in her mother; Hypothyroidism in her mother. Social History:  reports that she has never smoked. She has never used smokeless tobacco. She reports previous alcohol use. She reports that she does not use drugs.     Maternal Diabetes: No Genetic Screening: Normal Maternal Ultrasounds/Referrals: Normal Fetal Ultrasounds  or other Referrals:  None Maternal Substance Abuse:  No Significant Maternal Medications:  Meds include: Other:  Lovenox Significant Maternal Lab Results:  None Other Comments:  None  Review of Systems History   Blood pressure 118/66, pulse 86, temperature 98.3 F (36.8 C), temperature source Oral, resp. rate 16, height 5\' 6"  (1.676 m), weight 109.7 kg, SpO2 97 %. Exam Physical Exam  Gen: alert, well appearing, no distress Chest: nonlabored breathing CV: no peripheral edema Abdomen: soft, LLQ tenderness, no rebound or guarding.  Ext: no evidence of DVT  Prenatal labs: ABO, Rh:   Antibody:   Rubella:   RPR:    HBsAg:    HIV:    GBS:     Assessment/Plan: Admit to [facility] for observation, BMZ, pain control, ultrasound in AM OB [facility] Twins completed today, Dr. [place] rec observation overnight and repeat Parke Poisson in AM to better assess lower uterine segment. Continuous EFM and toco BMZ 8/2, 8/3 Pain control: Flexeril TID prn, tylenol. Diet: NPO overnight, will allow small meal for dinner for given recent improvement LR at 125 cc/hr DVT Ppx: SCDs - will hold tonight's dose of Lovenox. Resume upon stability or improvement. May consider early transition to heparin. MR BP noted on presentation. PIH labs WNL, BP improved.  All questions answered.   [place]  06/12/2021, 8:36 PM

## 2021-06-13 ENCOUNTER — Observation Stay (HOSPITAL_BASED_OUTPATIENT_CLINIC_OR_DEPARTMENT_OTHER): Payer: Commercial Managed Care - PPO

## 2021-06-13 DIAGNOSIS — O26893 Other specified pregnancy related conditions, third trimester: Secondary | ICD-10-CM

## 2021-06-13 DIAGNOSIS — O34219 Maternal care for unspecified type scar from previous cesarean delivery: Secondary | ICD-10-CM

## 2021-06-13 DIAGNOSIS — R109 Unspecified abdominal pain: Secondary | ICD-10-CM

## 2021-06-13 DIAGNOSIS — O09523 Supervision of elderly multigravida, third trimester: Secondary | ICD-10-CM | POA: Diagnosis not present

## 2021-06-13 DIAGNOSIS — O321XX2 Maternal care for breech presentation, fetus 2: Secondary | ICD-10-CM

## 2021-06-13 DIAGNOSIS — O34593 Maternal care for other abnormalities of gravid uterus, third trimester: Secondary | ICD-10-CM

## 2021-06-13 DIAGNOSIS — Z3A33 33 weeks gestation of pregnancy: Secondary | ICD-10-CM

## 2021-06-13 DIAGNOSIS — O30043 Twin pregnancy, dichorionic/diamniotic, third trimester: Secondary | ICD-10-CM | POA: Diagnosis not present

## 2021-06-13 DIAGNOSIS — O321XX1 Maternal care for breech presentation, fetus 1: Secondary | ICD-10-CM

## 2021-06-13 MED ORDER — NIFEDIPINE 10 MG PO CAPS
30.0000 mg | ORAL_CAPSULE | Freq: Once | ORAL | Status: DC
  Filled 2021-06-13: qty 3

## 2021-06-13 MED ORDER — NIFEDIPINE ER OSMOTIC RELEASE 30 MG PO TB24: 30.0000 mg | ORAL_TABLET | Freq: Two times a day (BID) | ORAL | 0 refills | Status: DC

## 2021-06-13 MED ORDER — LACTATED RINGERS IV BOLUS
500.0000 mL | Freq: Once | INTRAVENOUS | Status: AC
  Administered 2021-06-13: 500 mL via INTRAVENOUS

## 2021-06-13 MED ORDER — FAMOTIDINE 20 MG PO TABS
20.0000 mg | ORAL_TABLET | Freq: Two times a day (BID) | ORAL | Status: DC
  Administered 2021-06-13: 20 mg via ORAL
  Filled 2021-06-13: qty 1

## 2021-06-13 MED ORDER — NIFEDIPINE ER OSMOTIC RELEASE 30 MG PO TB24
30.0000 mg | ORAL_TABLET | Freq: Once | ORAL | Status: AC
  Administered 2021-06-13: 30 mg via ORAL
  Filled 2021-06-13: qty 1

## 2021-06-13 NOTE — Discharge Summary (Signed)
Physician Discharge Summary  Patient ID: Makayla Kaiser MRN: [id number] DOB/AGE: [birth year] 35 y.o.  Admit date: 06/12/2021 Discharge date: 06/13/2021  Admission Diagnoses:Pelvic pain, Threatened PTL, Twins, Previous c/s, previous uterine rupture  Discharge Diagnoses: same Active Problems:   High-risk pregnancy   Factor II deficiency (HCC)   Discharged Condition: good  Hospital Course: Admitted for pelvic pain with history above.  Pain improved with IVF and admitted overnight for monitoring of babies and preterm labor sxs.  MFM said normal uterus and babies breech with measurements on consult/US form.  Received BMZ x 2 and required procardia 30mg  XL for uterine irriitability. Pain had mostly resolved.  Pt desired d/c home with follow in 2 days.  Plan is to deliver by c/s at 36 weeks unless pain worsens then consider 34 week delivery.  Restart lovenox q HS for hx of blood clot.  Consults:  maternal fetal medicine  Significant Diagnostic Studies: ultrasound and fetal monitoring  Treatments: IV hydration and steroids: betamethasone x 2  Discharge Exam: Blood pressure 106/62, pulse 92, temperature 98.3 F (36.8 C), temperature source Oral, resp. rate 19, height 5\' 6"  (1.676 m), weight 109.7 kg, SpO2 98 %. General appearance: alert, cooperative, appears stated age, and no distress GI: soft, non-tender; bowel sounds normal; no masses,  no organomegaly  Disposition: Discharge disposition: 01-Home or Self Care       Discharge Instructions     Call MD for:  severe uncontrolled pain   Complete by: As directed    Diet - low sodium heart healthy   Complete by: As directed    Diet general   Complete by: As directed       Allergies as of 06/13/2021   No Known Allergies      Medication List     TAKE these medications    acetaminophen 500 MG tablet Commonly known as: TYLENOL Take 500 mg by mouth every 6 (six) hours as needed for moderate pain.   albuterol 108 (90  Base) MCG/ACT inhaler Commonly known as: VENTOLIN HFA Inhale 1-2 puffs into the lungs every 6 (six) hours as needed for wheezing or shortness of breath.   cetirizine 10 MG tablet Commonly known as: ZYRTEC Take 10 mg by mouth daily.   enoxaparin 40 MG/0.4ML injection Commonly known as: LOVENOX Inject 40 mg into the skin daily.   famotidine 20 MG tablet Commonly known as: PEPCID Take 20 mg by mouth 2 (two) times daily.   loratadine 10 MG tablet Commonly known as: CLARITIN Take 10 mg by mouth daily.   NIFEdipine 30 MG 24 hr tablet Commonly known as: Procardia XL Take 1 tablet (30 mg total) by mouth 2 (two) times daily.   prenatal multivitamin Tabs tablet Take 1 tablet by mouth daily at 12 noon.         Signed: 06/13/2021, 4:40 PM

## 2021-06-13 NOTE — Plan of Care (Signed)
  Problem: Education: Goal: Knowledge of disease or condition will improve Outcome: Completed/Met Goal: Knowledge of the prescribed therapeutic regimen will improve Outcome: Completed/Met Goal: Individualized Educational Video(s) Outcome: Completed/Met   Problem: Clinical Measurements: Goal: Complications related to the disease process, condition or treatment will be avoided or minimized Outcome: Completed/Met   Problem: Education: Goal: Knowledge of General Education information will improve Description: Including pain rating scale, medication(s)/side effects and non-pharmacologic comfort measures Outcome: Completed/Met   Problem: Health Behavior/Discharge Planning: Goal: Ability to manage health-related needs will improve Outcome: Completed/Met   Problem: Clinical Measurements: Goal: Ability to maintain clinical measurements within normal limits will improve Outcome: Completed/Met Goal: Will remain free from infection Outcome: Completed/Met Goal: Diagnostic test results will improve Outcome: Completed/Met Goal: Respiratory complications will improve Outcome: Completed/Met Goal: Cardiovascular complication will be avoided Outcome: Completed/Met   Problem: Activity: Goal: Risk for activity intolerance will decrease Outcome: Completed/Met   Problem: Nutrition: Goal: Adequate nutrition will be maintained Outcome: Completed/Met   Problem: Coping: Goal: Level of anxiety will decrease Outcome: Completed/Met   Problem: Elimination: Goal: Will not experience complications related to bowel motility Outcome: Completed/Met Goal: Will not experience complications related to urinary retention Outcome: Completed/Met   Problem: Pain Managment: Goal: General experience of comfort will improve Outcome: Completed/Met   Problem: Safety: Goal: Ability to remain free from injury will improve Outcome: Completed/Met   Problem: Skin Integrity: Goal: Risk for impaired skin  integrity will decrease Outcome: Completed/Met   

## 2021-06-13 NOTE — Progress Notes (Signed)
Patient ID: Makayla Kaiser, female   DOB: 02-13-1986, 35 y.o.   MRN: [id number] [name] just returned from [place] and said it was good Pain has improved and is only with palpation on the left lower quadrant or some movement GFM No ctxs  VSSAF Labs normal FHR 140s reactive x 2 with accel no decels Cat 1  Abd Gravid nt except mild discomfort along left incisional side.  No rebound guarding or palpable hernia  Twins (breech) at 32 6/7 Abd pain - [place] today to evaluate for uterine dehiscence (prev hx of this from first c.s) Pain improving and more c/s musculoskeletal pain today Will complete BMZ series today and if stable, consider outpt monitoring with twice weekly NST and C/S at 36weeks.  If pain recurs, then inpatient monitoring and c/s at 34 weeks Will resume regular diet and lovenox Pepcid per pt request

## 2021-06-13 NOTE — Consult Note (Signed)
MFM Note  Denica Web is a gravida 4 para 2-0-1-2 currently at 33 weeks and 4 days with a spontaneously conceived dichorionic, diamniotic twin gestation.  She was admitted overnight for observation due to significant left lower quadrant pain that that has been persistent over the last 3 to 4 days.  There was a concern for possible uterine rupture as a uterine window was noted at the time of her last cesarean delivery.  The patient received two doses of Flexeril for pain relief and is receiving a complete course of antenatal corticosteroids.  She denies any problems in her current pregnancy up until now. Her last bowel movement was yesterday.  Overnight, the patient reported that her pain has decreased.  Her fetal heart rate tracings were reactive x2.  Irregular contractions were noted on the toco.  She has a history of factor II deficiency with a prior thromboembolic event while taking birth control pills.  Due to this history, she is currently treated with prophylactic Lovenox 40 mg daily.  She has a history of 2 prior cesarean deliveries.  On today's exam, the fetal growth and amniotic fluid level appeared within normal limits for both twin A and twin B.  Two normal-appearing placentas (anterior and posterior) were noted.  I scanned the entire lower uterine segment including the area where her prior cesarean scar was and did not note any signs of a uterine window.  The limitations of ultrasound in the detection of a uterine window was discussed.  The two placentas are located far away from where her pain is.   Fetal movements of both fetuses were noted throughout the exam.  Doppler studies of the umbilical arteries performed today for both twin A and twin B showed normal forward flow.   As her pain is now decreased and there were no signs of a uterine window noted on today's ultrasound, she was advised that we keep her for observation until later this afternoon.  She may receive the second  dose of steroids a few hours early.  Should her severe pain return later today, I would continue inpatient management and consider delivery at 34 weeks.  Should her pain continue to decrease and she is discharged home, she should continue twice-weekly fetal testing in your office.  Due to the twin gestation and as a uterine window was noted during her prior C-section, I would recommend that she undergo a repeat cesarean delivery at 36 weeks.  She should be resumed on prophylactic Lovenox until delivery.  The patient stated that she is comfortable and happy with this management plan.  Recommendations:  Should she continue to experience severe abdominal pain, inpatient management and delivery at 34 weeks   Should her abdominal pain continue to decrease, discharge home later this afternoon   Should she be discharged home, twice-weekly fetal testing is recommended until delivery  Due to her history of a uterine window, a repeat cesarean delivery should be scheduled at around 36 weeks  Continue prophylactic Lovenox up until the day prior to her scheduled delivery

## 2021-06-14 LAB — CULTURE, OB URINE: Culture: <10000 — AB

## 2021-06-15 ENCOUNTER — Ambulatory Visit: Payer: Commercial Managed Care - PPO | Admitting: *Deleted

## 2021-06-15 ENCOUNTER — Encounter: Payer: Self-pay | Admitting: *Deleted

## 2021-06-15 ENCOUNTER — Inpatient Hospital Stay (HOSPITAL_BASED_OUTPATIENT_CLINIC_OR_DEPARTMENT_OTHER): Payer: Commercial Managed Care - PPO

## 2021-06-15 ENCOUNTER — Other Ambulatory Visit: Payer: Self-pay | Admitting: *Deleted

## 2021-06-15 ENCOUNTER — Ambulatory Visit (HOSPITAL_BASED_OUTPATIENT_CLINIC_OR_DEPARTMENT_OTHER): Payer: Commercial Managed Care - PPO | Admitting: *Deleted

## 2021-06-15 ENCOUNTER — Other Ambulatory Visit: Payer: Self-pay

## 2021-06-15 ENCOUNTER — Inpatient Hospital Stay (HOSPITAL_COMMUNITY)
Admission: AD | Admit: 2021-06-15 | Discharge: 2021-06-15 | Disposition: A | Discharge status: Home / Self Care | Payer: Commercial Managed Care - PPO | Attending: Obstetrics and Gynecology | Admitting: Obstetrics and Gynecology

## 2021-06-15 ENCOUNTER — Encounter (HOSPITAL_COMMUNITY): Payer: Self-pay | Admitting: Obstetrics and Gynecology

## 2021-06-15 VITALS — BP 125/78 | HR 86

## 2021-06-15 DIAGNOSIS — O321XX2 Maternal care for breech presentation, fetus 2: Secondary | ICD-10-CM

## 2021-06-15 DIAGNOSIS — O0993 Supervision of high risk pregnancy, unspecified, third trimester: Secondary | ICD-10-CM

## 2021-06-15 DIAGNOSIS — R109 Unspecified abdominal pain: Secondary | ICD-10-CM | POA: Diagnosis not present

## 2021-06-15 DIAGNOSIS — R1032 Left lower quadrant pain: Secondary | ICD-10-CM | POA: Insufficient documentation

## 2021-06-15 DIAGNOSIS — O30049 Twin pregnancy, dichorionic/diamniotic, unspecified trimester: Secondary | ICD-10-CM | POA: Insufficient documentation

## 2021-06-15 DIAGNOSIS — Z3A33 33 weeks gestation of pregnancy: Secondary | ICD-10-CM | POA: Insufficient documentation

## 2021-06-15 DIAGNOSIS — O26893 Other specified pregnancy related conditions, third trimester: Secondary | ICD-10-CM

## 2021-06-15 DIAGNOSIS — O30043 Twin pregnancy, dichorionic/diamniotic, third trimester: Secondary | ICD-10-CM

## 2021-06-15 DIAGNOSIS — R030 Elevated blood-pressure reading, without diagnosis of hypertension: Secondary | ICD-10-CM | POA: Diagnosis not present

## 2021-06-15 DIAGNOSIS — O288 Other abnormal findings on antenatal screening of mother: Secondary | ICD-10-CM | POA: Diagnosis not present

## 2021-06-15 DIAGNOSIS — O289 Unspecified abnormal findings on antenatal screening of mother: Secondary | ICD-10-CM

## 2021-06-15 DIAGNOSIS — Z3689 Encounter for other specified antenatal screening: Secondary | ICD-10-CM | POA: Insufficient documentation

## 2021-06-15 DIAGNOSIS — Z98891 History of uterine scar from previous surgery: Secondary | ICD-10-CM

## 2021-06-15 DIAGNOSIS — O34593 Maternal care for other abnormalities of gravid uterus, third trimester: Secondary | ICD-10-CM

## 2021-06-15 DIAGNOSIS — O368131 Decreased fetal movements, third trimester, fetus 1: Secondary | ICD-10-CM | POA: Diagnosis not present

## 2021-06-15 DIAGNOSIS — O09523 Supervision of elderly multigravida, third trimester: Secondary | ICD-10-CM

## 2021-06-15 DIAGNOSIS — O368132 Decreased fetal movements, third trimester, fetus 2: Secondary | ICD-10-CM | POA: Diagnosis not present

## 2021-06-15 DIAGNOSIS — O321XX1 Maternal care for breech presentation, fetus 1: Secondary | ICD-10-CM

## 2021-06-15 LAB — CBC WITH DIFFERENTIAL/PLATELET
Abs Immature Granulocytes: 0.12 10*3/uL — ABNORMAL HIGH (ref 0.00–0.07)
Basophils Absolute: 0 10*3/uL (ref 0.0–0.1)
Basophils Relative: 0 %
Eosinophils Absolute: 0.1 10*3/uL (ref 0.0–0.5)
Eosinophils Relative: 1 %
HCT: 30.5 % — ABNORMAL LOW (ref 36.0–46.0)
Hemoglobin: 10 g/dL — ABNORMAL LOW (ref 12.0–15.0)
Immature Granulocytes: 1 %
Lymphocytes Relative: 23 %
Lymphs Abs: 2.2 10*3/uL (ref 0.7–4.0)
MCH: 28.8 pg (ref 26.0–34.0)
MCHC: 32.8 g/dL (ref 30.0–36.0)
MCV: 87.9 fL (ref 80.0–100.0)
Monocytes Absolute: 0.9 10*3/uL (ref 0.1–1.0)
Monocytes Relative: 9 %
Neutro Abs: 6.4 10*3/uL (ref 1.7–7.7)
Neutrophils Relative %: 66 %
Platelets: 252 10*3/uL (ref 150–400)
RBC: 3.47 MIL/uL — ABNORMAL LOW (ref 3.87–5.11)
RDW: 12.4 % (ref 11.5–15.5)
WBC: 9.7 10*3/uL (ref 4.0–10.5)
nRBC: 0 % (ref 0.0–0.2)

## 2021-06-15 LAB — COMPREHENSIVE METABOLIC PANEL
ALT: 16 U/L (ref 0–44)
AST: 20 U/L (ref 15–41)
Albumin: 2.9 g/dL — ABNORMAL LOW (ref 3.5–5.0)
Alkaline Phosphatase: 274 U/L — ABNORMAL HIGH (ref 38–126)
Anion gap: 9 (ref 5–15)
BUN: 6 mg/dL (ref 6–20)
CO2: 23 mmol/L (ref 22–32)
Calcium: 8.5 mg/dL — ABNORMAL LOW (ref 8.9–10.3)
Chloride: 104 mmol/L (ref 98–111)
Creatinine, Ser: 0.64 mg/dL (ref 0.44–1.00)
GFR, Estimated: >60 mL/min (ref >60)
Glucose, Bld: 95 mg/dL (ref 70–99)
Potassium: 3.3 mmol/L — ABNORMAL LOW (ref 3.5–5.1)
Sodium: 136 mmol/L (ref 135–145)
Total Bilirubin: 0.4 mg/dL (ref 0.3–1.2)
Total Protein: 6.6 g/dL (ref 6.5–8.1)

## 2021-06-15 LAB — PROTEIN / CREATININE RATIO, URINE
Creatinine, Urine: 35.4 mg/dL
Total Protein, Urine: <6 mg/dL

## 2021-06-15 NOTE — Procedures (Signed)
Makayla Kaiser [birth year] [redacted]w[redacted]d   Fetus B Non-Stress Test Interpretation for 06/15/21  Indication:  di di twins  Fetal Heart Rate Fetus B Mode: External Baseline Rate (B): 145 BPM Variability: Moderate Accelerations: None Decelerations: None  Uterine Activity Mode: Palpation, Toco Contraction Frequency (min): 4 uc's with ui Contraction Duration (sec): 50-60 Contraction Quality: Mild Resting Tone Palpated: Relaxed Resting Time: Adequate  Interpretation (Baby B - Fetal Testing) Nonstress Test Interpretation (Baby B): Non-reactive (Patient sent to [facility] for futher monitoring.) Overall Impression (Baby B): Reassuring for gestational age Comments (Baby B): Dr. Parke Poisson reviewed tracing  Makayla Kaiser [birth year] [redacted]w[redacted]d  Fetus A Non-Stress Test Interpretation for 06/15/21  Indication:  di di twins  Fetal Heart Rate A Mode: External Baseline Rate (A): 140 bpm Variability: Moderate Accelerations: None Decelerations: None Multiple birth?: Yes  Uterine Activity Mode: Palpation, Toco Contraction Frequency (min): 4 uc's with ui Contraction Duration (sec): 50-60 Contraction Quality: Mild Resting Tone Palpated: Relaxed Resting Time: Adequate  Interpretation (Fetal Testing) Nonstress Test Interpretation: Non-reactive (Patient sent to [facility] MAU for futher monitoring.) Overall Impression: Reassuring for gestational age Comments: Dr. Parke Poisson reviewed tracing

## 2021-06-15 NOTE — MAU Provider Note (Signed)
[facility] MAU Note  History     CSN: [id number]  Arrival date & time 06/15/21  1304    Chief Complaint  Patient presents with   non reactive NST    Makayla Kaiser is a 35 y.o. G4P1002 at 3768w1d who presents to MAU today for evaluation of a nonreactive NST with MFM, Dr. Parke PoissonFang.  This is a di-di twin gestation.  She also reports decreased fetal movement since this morning.  She had felt them move less than usual yesterday, however had something to eat and focused so then noted improved/normal movement by the evening.  She states the pelvic pain that she was experiencing during her recent admission is no longer present.  She still has intermittent contractions that are "manageable" and infrequent, especially not similar to recent admission as discussed below.  Has Procardia 30mg  XL at home. She denies any headache, blurred vision, RUQ pain, worsening swelling, SOB, leakage of fluid, dysuria, vaginal discharge, or vaginal bleeding.  Of note she was recently admitted on 8/2-8/3 for pelvic pain and threatened preterm labor.  She received BMZ X2 and Procardia 30 mg for irritability with pain mostly resolving.  She initially had nonreactive NSTs, however fetal heart tracing was reactive X2 during overnight admission.  Due the twin gestation and known uterine window with previous C-section, MFM had recommended repeat cesarean at 36 weeks or consider delivery at 34 weeks if her pain was to return.  OB History  Gravida Para Term Preterm AB Living  4 1 1  0 1 2  SAB IAB Ectopic Multiple Live Births  1 0 0 0 2    # Outcome Date GA Lbr Len/2nd Weight Sex Delivery Anes PTL Lv  4 Current           3 Term 01/20/19 397w1d  2770 g F CS-LTranv Spinal  LIV     Name: Bisping,GIRL Hilary     Apgar1: 9  Apgar5: 9  2 Gravida 2018    F CS-LTranv   LIV  1 SAB             Past Medical History:  Diagnosis Date   Asthma    Factor II deficiency (HCC)    History of DVT (deep vein thrombosis)      Past Surgical History:  Procedure Laterality Date   APPENDECTOMY     CESAREAN SECTION     CESAREAN SECTION N/A 01/20/2019   Procedure: CESAREAN SECTION;  Surgeon: Harold Hedgeomblin, James, MD;  Location: [facility];  Service: Obstetrics;  Laterality: N/A;  Repeat edc 02/02/19 NKDA Tracey RNFA   TONSILLECTOMY      Family History  Problem Relation Age of Onset   Factor V Leiden deficiency Mother    Hypothyroidism Mother    Heart disease Mother    Factor V Leiden deficiency Sister    Clotting disorder Sister     Social History   Tobacco Use   Smoking status: Never   Smokeless tobacco: Never  Vaping Use   Vaping Use: Never used  Substance Use Topics   Alcohol use: Not Currently   Drug use: Never    No Known Allergies  No medications prior to admission.   Review of Systems  Constitutional:  Negative for fever.  Eyes:  Negative for blurred vision and double vision.  Respiratory:  Negative for shortness of breath.   Cardiovascular:  Negative for chest pain and leg swelling.  Gastrointestinal:  Negative for diarrhea, nausea and vomiting.  Genitourinary:  Negative for dysuria.  Neurological:  Negative for dizziness and headaches.    Physical Exam  BP 135/84   Pulse 85   Temp 98.4 F (36.9 C)   Resp 18   SpO2 96%  GENERAL: Well-developed, well-nourished female in no acute distress  SKIN: Warm, dry and without erythema PSYCH: Normal mood and affect HEENT: Normocephalic, atraumatic.   LUNGS: Normal respiratory effort HEART: Regular rate noted ABDOMEN: Soft, nondistended, nontender, gravid uterus  PELVIC: Deferred EXTREMITIES: No edema, no cyanosis  MAU Course/MDM  NST nonreactive at MFM, will continue with prolonged monitoring. Pre-e labs collected due to elevated blood pressure on arrival, reassuringly negative on 8/2.  Asymptomatic.  NST reviewed  Twin A Baseline 140bpm/mod var/one 15x15 accel, few 10x10, no decel  Twin B baseline 135-140bpm/mod var/10x10 accels,  no decel Contractions every 8-30 minutes  Reassessed at 1650: BPP performed.  She feels improved fetal movement, however still feels that they are moving less than baseline.  States she has not had much to eat today and is hopeful this will help when she gets home. Labs and Imaging   Results for orders placed or performed during the hospital encounter of 06/15/21 (from the past 24 hour(s))  Comprehensive metabolic panel     Status: Abnormal   Collection Time: 06/15/21  1:53 PM  Result Value Ref Range   Sodium 136 135 - 145 mmol/L   Potassium 3.3 (L) 3.5 - 5.1 mmol/L   Chloride 104 98 - 111 mmol/L   CO2 23 22 - 32 mmol/L   Glucose, Bld 95 70 - 99 mg/dL   BUN 6 6 - 20 mg/dL   Creatinine, Ser 1.61 0.44 - 1.00 mg/dL   Calcium 8.5 (L) 8.9 - 10.3 mg/dL   Total Protein 6.6 6.5 - 8.1 g/dL   Albumin 2.9 (L) 3.5 - 5.0 g/dL   AST 20 15 - 41 U/L   ALT 16 0 - 44 U/L   Alkaline Phosphatase 274 (H) 38 - 126 U/L   Total Bilirubin 0.4 0.3 - 1.2 mg/dL   GFR, Estimated >09 >60 mL/min   Anion gap 9 5 - 15  Protein / creatinine ratio, urine     Status: None   Collection Time: 06/15/21  1:54 PM  Result Value Ref Range   Creatinine, Urine 35.40 mg/dL   Total Protein, Urine <6.0 mg/dL   Protein Creatinine Ratio        0.00 - 0.15 mg/mg[Cre]  CBC with Differential/Platelet     Status: Abnormal   Collection Time: 06/15/21  2:30 PM  Result Value Ref Range   WBC 9.7 4.0 - 10.5 K/uL   RBC 3.47 (L) 3.87 - 5.11 MIL/uL   Hemoglobin 10.0 (L) 12.0 - 15.0 g/dL   HCT 45.4 (L) 09.8 - 11.9 %   MCV 87.9 80.0 - 100.0 fL   MCH 28.8 26.0 - 34.0 pg   MCHC 32.8 30.0 - 36.0 g/dL   RDW 14.7 82.9 - 56.2 %   Platelets 252 150 - 400 K/uL   nRBC 0.0 0.0 - 0.2 %   Neutrophils Relative % 66 %   Neutro Abs 6.4 1.7 - 7.7 K/uL   Lymphocytes Relative 23 %   Lymphs Abs 2.2 0.7 - 4.0 K/uL   Monocytes Relative 9 %   Monocytes Absolute 0.9 0.1 - 1.0 K/uL   Eosinophils Relative 1 %   Eosinophils Absolute 0.1 0.0 - 0.5 K/uL    Basophils Relative 0 %   Basophils Absolute 0.0  0.0 - 0.1 K/uL   Immature Granulocytes 1 %   Abs Immature Granulocytes 0.12 (H) 0.00 - 0.07 K/uL   [place] MFM FETAL BPP WO NON STRESS  Result Date: 06/15/2021 ----------------------------------------------------------------------  OBSTETRICS REPORT                       (Signed Final 06/15/2021 04:40 pm) ---------------------------------------------------------------------- Patient Info  ID #:       [id number]                          D.O.B.:  Jan 19, 1986 (35 yrs)  Name:       Makayla Kaiser [facility]           Visit Date: 06/15/2021 03:47 pm ---------------------------------------------------------------------- Performed By  Attending:        Ma Rings MD         Referred By:      Marylen Ponto  Performed By:     Reinaldo Raddle            [facility] ---------------------------------------------------------------------- Orders  #  Description                           Code        Ordered By  1  [place] MFM FETAL BPP WO NON               40981.19    Jonty Morrical     STRESS  2  [place] MFM FETAL BPP WO NST               14782.9     Leticia Penna     ADDL GESTATION ----------------------------------------------------------------------  #  Order #                     Accession #                Episode #  1  [id number]                   [id number]                 [id number]  2  [id number]                   [id number]                 [id number] ---------------------------------------------------------------------- Indications  Non-reactive NST                               O28.9  Twin pregnancy, di/di, third trimester         O30.043  [redacted] weeks gestation of pregnancy                Z3A.33  Abdominal pain in pregnancy                    O99.89  Medical complication of pregnancy (uterine  O26.90  window)  Advanced maternal age multigravida 11+,        O22.523  third trimester (35)  ---------------------------------------------------------------------- Fetal Evaluation (Fetus A)  Num Of Fetuses:         2  Fetal Heart Rate(bpm):  132  Cardiac Activity:       Observed  Fetal Lie:              Maternal right side Lower  Presentation:           Breech  Placenta:               Posterior  Amniotic Fluid  AFI FV:      Within normal limits                              Largest Pocket(cm)                              4.8 ---------------------------------------------------------------------- Biophysical Evaluation (Fetus A)  Amniotic F.V:   Within normal limits       F. Tone:        Observed  F. Movement:    Observed                   Score:          8/8  F. Breathing:   Observed ---------------------------------------------------------------------- Gestational Age (Fetus A)  Clinical EDD:  33w 6d                                        EDD:   07/28/21  Best:          33w 6d     Det. By:  Clinical EDD             EDD:   07/28/21 ---------------------------------------------------------------------- Anatomy (Fetus A)  Diaphragm:             Appears normal         Kidneys:                Appear normal  Stomach:               Appears normal, left   Bladder:                Appears normal                         sided ---------------------------------------------------------------------- Fetal Evaluation (Fetus B)  Num Of Fetuses:         2  Fetal Heart Rate(bpm):  140  Cardiac Activity:       Observed  Fetal Lie:              Maternal left side Upper  Presentation:           Breech  Placenta:               Anterior  Amniotic Fluid  AFI FV:      Within normal limits                              Largest Pocket(cm)  2.7 ---------------------------------------------------------------------- Biophysical Evaluation (Fetus B)  Amniotic F.V:   Within normal limits       F. Tone:        Observed  F. Movement:    Observed                   Score:          8/8  F. Breathing:   Observed  ---------------------------------------------------------------------- Gestational Age (Fetus B)  Clinical EDD:  33w 6d                                        EDD:   07/28/21  Best:          33w 6d     Det. By:  Clinical EDD             EDD:   07/28/21 ---------------------------------------------------------------------- Anatomy (Fetus B)  Diaphragm:             Appears normal         Kidneys:                Appear normal  Stomach:               Appears normal, left   Bladder:                Appears normal                         sided ---------------------------------------------------------------------- Cervix Uterus Adnexa  Cervix  Not visualized (advanced GA >24wks) ---------------------------------------------------------------------- Comments  This patient was sent to the MAU following a nonreactive NST  for both fetuses in the MFM office.  A biophysical profile performed today was 8 out of 8 for both  twin A and twin B.  There was normal amniotic fluid noted on today's ultrasound  exam around both fetuses. ----------------------------------------------------------------------                   Ma Rings, MD Electronically Signed Final Report   06/15/2021 04:40 pm ----------------------------------------------------------------------  [place] MFM FETAL BPP WO NST ADDL GESTATION  Result Date: 06/15/2021 ----------------------------------------------------------------------  OBSTETRICS REPORT                       (Signed Final 06/15/2021 04:40 pm) ---------------------------------------------------------------------- Patient Info  ID #:       [id number]                          D.O.B.:  1986/05/17 (35 yrs)  Name:       Makayla Kaiser [facility]           Visit Date: 06/15/2021 03:47 pm ---------------------------------------------------------------------- Performed By  Attending:        Ma Rings MD         Referred By:      Marylen Ponto  Performed By:     Reinaldo Raddle            [facility] ---------------------------------------------------------------------- Orders  #  Description                           Code        Ordered By  1  [place] MFM FETAL BPP WO NON               76819.01    Jannis Atkins     STRESS  2  [place] MFM FETAL BPP WO NST               76819.1     [facility] Marieke Lubke     ADDL GESTATION ----------------------------------------------------------------------  #  Order #                     Accession #                Episode #  1  [id number]                   [id number]                 [id number]  2  [id number]                   [id number]                 [id number] ---------------------------------------------------------------------- Indications  Non-reactive NST                               O28.9  Twin pregnancy, di/di, third trimester         O30.043  [redacted] weeks gestation of pregnancy                Z3A.33  Abdominal pain in pregnancy                    O99.89  Medical complication of pregnancy (uterine     O26.90  window)  Advanced maternal age multigravida 47+,        O35.523  third trimester (35) ---------------------------------------------------------------------- Fetal Evaluation (Fetus A)  Num Of Fetuses:         2  Fetal Heart Rate(bpm):  132  Cardiac Activity:       Observed  Fetal Lie:              Maternal right side Lower  Presentation:           Breech  Placenta:               Posterior  Amniotic Fluid  AFI FV:      Within normal limits                              Largest Pocket(cm)                              4.8 ---------------------------------------------------------------------- Biophysical Evaluation (Fetus A)  Amniotic F.V:   Within normal limits       F. Tone:        Observed  F. Movement:    Observed                   Score:          8/8  F. Breathing:   Observed ---------------------------------------------------------------------- Gestational  Age (Fetus A)  Clinical EDD:  33w 6d                                         EDD:   07/28/21  Best:          33w 6d     Det. By:  Clinical EDD             EDD:   07/28/21 ---------------------------------------------------------------------- Anatomy (Fetus A)  Diaphragm:             Appears normal         Kidneys:                Appear normal  Stomach:               Appears normal, left   Bladder:                Appears normal                         sided ---------------------------------------------------------------------- Fetal Evaluation (Fetus B)  Num Of Fetuses:         2  Fetal Heart Rate(bpm):  140  Cardiac Activity:       Observed  Fetal Lie:              Maternal left side Upper  Presentation:           Breech  Placenta:               Anterior  Amniotic Fluid  AFI FV:      Within normal limits                              Largest Pocket(cm)                              2.7 ---------------------------------------------------------------------- Biophysical Evaluation (Fetus B)  Amniotic F.V:   Within normal limits       F. Tone:        Observed  F. Movement:    Observed                   Score:          8/8  F. Breathing:   Observed ---------------------------------------------------------------------- Gestational Age (Fetus B)  Clinical EDD:  33w 6d                                        EDD:   07/28/21  Best:          33w 6d     Det. By:  Clinical EDD             EDD:   07/28/21 ---------------------------------------------------------------------- Anatomy (Fetus B)  Diaphragm:             Appears normal         Kidneys:                Appear normal  Stomach:               Appears normal, left   Bladder:  Appears normal                         sided ---------------------------------------------------------------------- Cervix Uterus Adnexa  Cervix  Not visualized (advanced GA >24wks) ---------------------------------------------------------------------- Comments  This patient was sent to the MAU following a nonreactive NST  for both fetuses in the  MFM office.  A biophysical profile performed today was 8 out of 8 for both  twin A and twin B.  There was normal amniotic fluid noted on today's ultrasound  exam around both fetuses. ----------------------------------------------------------------------                   Ma Rings, MD Electronically Signed Final Report   06/15/2021 04:40 pm ----------------------------------------------------------------------   Assessment and Plan   1. Non-reactive NST (non-stress test)   2. [redacted] weeks gestation of pregnancy   3. Dichorionic diamniotic twin pregnancy in third trimester   4. Elevated blood pressure reading without diagnosis of hypertension    35 year old female G4P1012 at [redacted]w[redacted]d with Di-Di twin gestation presenting from MFM due to nonreactive NST.  NST category 1 tracing for both and reassuring, however nonreactive.  BPP obtained showing 8/8 score for both (8/10 with NST).  Patient noted continued decreased fetal movement, however improved since being in the MAU with apple juice/water and monitoring kicks (may in setting of recent BMZ).  Provided courtesy call to her OB/GYN on-call provider, Dr. Vincente Poli, who recommended repeating NST/BPP tomorrow.  Provided reassurance to patient with findings above.  BP elevated on arrival today (non-severe range) due to anxiety per patient, Pre-e labs WNL and asymptomatic.  BP normalized prior to DC without medication.  Plan for repeat NST/BPP tomorrow, 8/6 in the MAU. Monitor BP during prenatal appointments. Patient discharged home in stable condition.   Allergies as of 06/15/2021   No Known Allergies      Medication List     STOP taking these medications    albuterol 108 (90 Base) MCG/ACT inhaler Commonly known as: VENTOLIN HFA   loratadine 10 MG tablet Commonly known as: CLARITIN       TAKE these medications    acetaminophen 500 MG tablet Commonly known as: TYLENOL Take 500 mg by mouth every 6 (six) hours as needed for moderate pain.    cetirizine 10 MG tablet Commonly known as: ZYRTEC Take 10 mg by mouth daily.   enoxaparin 40 MG/0.4ML injection Commonly known as: LOVENOX Inject 40 mg into the skin daily.   famotidine 20 MG tablet Commonly known as: PEPCID Take 20 mg by mouth 2 (two) times daily.   NIFEdipine 30 MG 24 hr tablet Commonly known as: Procardia XL Take 1 tablet (30 mg total) by mouth 2 (two) times daily.   prenatal multivitamin Tabs tablet Take 1 tablet by mouth daily at 12 noon.         Leticia Penna, DO  [facility] [employer]

## 2021-06-15 NOTE — MAU Note (Signed)
Sent from MFM for non reactive NST on 33 week twins. Pt reports good fetal movement denies any vag bleeding or leaking . Reports some ctx but nothing regular.

## 2021-06-15 NOTE — Progress Notes (Signed)
MFM Note  Makayla Kaiser presented to our office for a nonstress test today.  She is currently at 33 weeks and 6 days with a dichorionic, diamniotic twin gestation.  She was hospitalized a few days ago due to severe left lower quadrant pain.  A uterine window was noted during her last cesarean section.  The patient reports that her left lower quadrant pain has resolved completely.  The patient was placed on the fetal heart rate monitor for over an hour and a half in our office.  The NST was nonreactive for both fetuses.  Irregular contractions were noted on the toco.    The patient reports that she feels fetal movements of both fetuses.    Due to the nonreactive NST today, she was sent to the MAU for prolonged monitoring.    Should the patient be discharged home from the MAU, she will return to our office next week for continued twice-weekly fetal testing.    She already has a cesarean delivery scheduled at around 36 weeks.

## 2021-06-15 NOTE — Discharge Instructions (Signed)
Your biophysical profile was 8 out of 8 which is a very reassuring test for both.  Pay attention to when your baby is most active. You may notice your baby's sleep and wake cycles. You may also notice things that make your baby move more. You should do a fetal movement count: When your baby is normally most active. At the same time each day. A good time to count movements is while you are resting, after having something to eat and drink.  Return tomorrow, 8/6, for repeat NST/BPP.

## 2021-06-16 ENCOUNTER — Encounter (HOSPITAL_COMMUNITY): Payer: Self-pay | Admitting: Obstetrics and Gynecology

## 2021-06-16 ENCOUNTER — Other Ambulatory Visit: Payer: Self-pay

## 2021-06-16 ENCOUNTER — Inpatient Hospital Stay (HOSPITAL_COMMUNITY)
Admission: AD | Admit: 2021-06-16 | Discharge: 2021-06-16 | Disposition: A | Discharge status: Home / Self Care | Payer: Commercial Managed Care - PPO | Attending: Obstetrics and Gynecology | Admitting: Obstetrics and Gynecology

## 2021-06-16 ENCOUNTER — Inpatient Hospital Stay (HOSPITAL_BASED_OUTPATIENT_CLINIC_OR_DEPARTMENT_OTHER): Payer: Commercial Managed Care - PPO

## 2021-06-16 DIAGNOSIS — Z3A33 33 weeks gestation of pregnancy: Secondary | ICD-10-CM | POA: Diagnosis not present

## 2021-06-16 DIAGNOSIS — Z3689 Encounter for other specified antenatal screening: Secondary | ICD-10-CM

## 2021-06-16 DIAGNOSIS — O368131 Decreased fetal movements, third trimester, fetus 1: Secondary | ICD-10-CM

## 2021-06-16 DIAGNOSIS — O368132 Decreased fetal movements, third trimester, fetus 2: Secondary | ICD-10-CM | POA: Insufficient documentation

## 2021-06-16 DIAGNOSIS — R109 Unspecified abdominal pain: Secondary | ICD-10-CM

## 2021-06-16 DIAGNOSIS — O26893 Other specified pregnancy related conditions, third trimester: Secondary | ICD-10-CM

## 2021-06-16 DIAGNOSIS — R030 Elevated blood-pressure reading, without diagnosis of hypertension: Secondary | ICD-10-CM | POA: Diagnosis not present

## 2021-06-16 DIAGNOSIS — O30043 Twin pregnancy, dichorionic/diamniotic, third trimester: Secondary | ICD-10-CM | POA: Diagnosis not present

## 2021-06-16 DIAGNOSIS — Z3A34 34 weeks gestation of pregnancy: Secondary | ICD-10-CM

## 2021-06-16 DIAGNOSIS — O09523 Supervision of elderly multigravida, third trimester: Secondary | ICD-10-CM

## 2021-06-16 DIAGNOSIS — O368130 Decreased fetal movements, third trimester, not applicable or unspecified: Secondary | ICD-10-CM | POA: Diagnosis not present

## 2021-06-16 DIAGNOSIS — Z86718 Personal history of other venous thrombosis and embolism: Secondary | ICD-10-CM | POA: Diagnosis not present

## 2021-06-16 DIAGNOSIS — O288 Other abnormal findings on antenatal screening of mother: Secondary | ICD-10-CM | POA: Diagnosis not present

## 2021-06-16 DIAGNOSIS — O368190 Decreased fetal movements, unspecified trimester, not applicable or unspecified: Secondary | ICD-10-CM

## 2021-06-16 NOTE — Discharge Instructions (Signed)

## 2021-06-16 NOTE — MAU Note (Signed)
Non-reactive yesterday,  BPPs were 8/8, was told to come back today to repeat the NSTs. Reports +FM, though has been less lately.  Denies pain, bleeding or leaking.

## 2021-06-16 NOTE — MAU Note (Signed)
States BP is always elevated when comes in- anxious.  Denies HA< visual changes, epigastric pain or increase in swelling.

## 2021-06-16 NOTE — MAU Provider Note (Signed)
History     CSN: [id number]  Arrival date and time: 06/16/21 1024   Event Date/Time   First Provider Initiated Contact with Patient 06/16/21 1053      Chief Complaint  Patient presents with   for NST   HPI Makayla Kaiser is a 35 y.o. [id number] at [redacted]w[redacted]d who presents for repeat NST/BPP. She was seen in MAU yesterday due to non reactive NSTs and decreased fetal movement. She reports today she is feeling normal movement of both babies. She denies any contractions, leaking or bleeding.   OB History     Gravida  4   Para  1   Term  1   Preterm      AB  1   Living  2      SAB  1   IAB      Ectopic      Multiple  0   Live Births  2           Past Medical History:  Diagnosis Date   Asthma    Factor II deficiency (HCC)    History of DVT (deep vein thrombosis)     Past Surgical History:  Procedure Laterality Date   APPENDECTOMY     CESAREAN SECTION     CESAREAN SECTION N/A 01/20/2019   Procedure: CESAREAN SECTION;  Surgeon: Harold Hedge, MD;  Location: [facility];  Service: Obstetrics;  Laterality: N/A;  Repeat edc 02/02/19 NKDA Tracey RNFA   TONSILLECTOMY      Family History  Problem Relation Age of Onset   Factor V Leiden deficiency Mother    Hypothyroidism Mother    Heart disease Mother    Factor V Leiden deficiency Sister    Clotting disorder Sister     Social History   Tobacco Use   Smoking status: Never   Smokeless tobacco: Never  Vaping Use   Vaping Use: Never used  Substance Use Topics   Alcohol use: Not Currently   Drug use: Never    Allergies: No Known Allergies  Medications Prior to Admission  Medication Sig Dispense Refill Last Dose   acetaminophen (TYLENOL) 500 MG tablet Take 500 mg by mouth every 6 (six) hours as needed for moderate pain.   06/16/2021   cetirizine (ZYRTEC) 10 MG tablet Take 10 mg by mouth daily.   06/16/2021   famotidine (PEPCID) 20 MG tablet Take 20 mg by mouth 2 (two) times daily.   06/16/2021    enoxaparin (LOVENOX) 40 MG/0.4ML injection Inject 40 mg into the skin daily.      NIFEdipine (PROCARDIA XL) 30 MG 24 hr tablet Take 1 tablet (30 mg total) by mouth 2 (two) times daily. 60 tablet 0    Prenatal Vit-Fe Fumarate-FA (PRENATAL MULTIVITAMIN) TABS tablet Take 1 tablet by mouth daily at 12 noon.       Review of Systems  Constitutional: Negative.  Negative for fatigue and fever.  HENT: Negative.    Respiratory: Negative.  Negative for shortness of breath.   Cardiovascular: Negative.  Negative for chest pain.  Gastrointestinal: Negative.  Negative for abdominal pain, constipation, diarrhea, nausea and vomiting.  Genitourinary: Negative.  Negative for dysuria, vaginal bleeding and vaginal discharge.  Neurological: Negative.  Negative for dizziness and headaches.  Physical Exam   Blood pressure 137/78, pulse 81, temperature 98.7 F (37.1 C), temperature source Oral, resp. rate 17, height 5\' 6"  (1.676 m), weight 109.6 kg, SpO2 99 %.  Physical Exam Vitals and nursing note reviewed.  Constitutional:      General: She is not in acute distress.    Appearance: She is well-developed.  HENT:     Head: Normocephalic.  Eyes:     Pupils: Pupils are equal, round, and reactive to light.  Cardiovascular:     Rate and Rhythm: Normal rate and regular rhythm.     Heart sounds: Normal heart sounds.  Pulmonary:     Effort: Pulmonary effort is normal. No respiratory distress.     Breath sounds: Normal breath sounds.  Abdominal:     General: Bowel sounds are normal. There is no distension.     Palpations: Abdomen is soft.     Tenderness: There is no abdominal tenderness.  Skin:    General: Skin is warm and dry.  Neurological:     Mental Status: She is alert and oriented to person, place, and time.  Psychiatric:        Mood and Affect: Mood normal.        Behavior: Behavior normal.        Thought Content: Thought content normal.        Judgment: Judgment normal.   Fetal Tracing:  Baby  A  Baseline: 140 Variability: moderate Accels: 15x15 Decels: none  Baby B  Baseline: 150 Variability: moderate Accels: 15x15 Decels: none  Toco: UI   MAU Course  Procedures US MFM Fetal BPP Wo Non Stress  Result Date: 06/16/2021 ----------------------------------------------------------------------  OBSTETRICS REPORT                       (Signed Final 06/16/2021 04:53 pm) ---------------------------------------------------------------------- Patient Info  ID #:       [id number]                          D.O.B.:  1986/10/16 (35 yrs)  Name:       Makayla Kaiser           Visit Date: 06/16/2021 12:47 pm ---------------------------------------------------------------------- Performed By  Attending:        Ma RingsVictor Fang MD         Referred By:       Marylen PontoNICOLE E NUGENT  Performed By:     Birdena CrandallYasemin Karatas        [facility] ---------------------------------------------------------------------- Orders  #  Description                           Code        Ordered By  1  US MFM FETAL BPP WO NON               76819.01    Eular Panek     STRESS  2  US MFM FETAL BPP WO NST               [id number]     Kitiara Hintze     ADDL GESTATION ----------------------------------------------------------------------  #  Order #  Accession #                Episode #  1  [id number]                   [id number]                 [id number]  2  [id number]                   [id number]                 [id number] ---------------------------------------------------------------------- Indications  Decreased fetal movements, third trimester,     O36.8130  unspecified  Non-reactive NST                                O28.9  Twin pregnancy, di/di, third trimester          O30.043  Abdominal pain in pregnancy                     O99.89  Medical complication of pregnancy (uterine      O26.90  window)  Advanced maternal age  multigravida 82+,         O48.523  third trimester (48)  [redacted] weeks gestation of pregnancy                 Z3A.34 ---------------------------------------------------------------------- Fetal Evaluation (Fetus A)  Num Of Fetuses:          2  Fetal Heart Rate(bpm):   136  Cardiac Activity:        Observed  Fetal Lie:               Maternal right side, Lower  Presentation:            Breech  Placenta:                Posterior  Membrane Desc:      Dividing Membrane seen  Amniotic Fluid  AFI FV:      Within normal limits                              Largest Pocket(cm)                              2.4 ---------------------------------------------------------------------- Biophysical Evaluation (Fetus A)  Amniotic F.V:   Within normal limits       F. Tone:         Observed  F. Movement:    Observed                   Score:           8/8  F. Breathing:   Observed ---------------------------------------------------------------------- Gestational Age (Fetus A)  Clinical EDD:  34w 0d                                        EDD:   07/28/21  Best:          34w 0d     Det. By:  Clinical EDD             EDD:   07/28/21 ---------------------------------------------------------------------- Anatomy (Fetus A)  Stomach:  Appears normal, left   Bladder:                Appears normal                         sided  Kidneys:               Appear normal ---------------------------------------------------------------------- Fetal Evaluation (Fetus B)  Num Of Fetuses:          2  Fetal Heart Rate(bpm):   131  Cardiac Activity:        Observed  Fetal Lie:               Maternal left side, Upper  Presentation:            Breech  Placenta:                Anterior  Membrane Desc:      Dividing Membrane seen  Amniotic Fluid  AFI FV:      Within normal limits                              Largest Pocket(cm)                              3.3 ---------------------------------------------------------------------- Biophysical Evaluation (Fetus  B)  Amniotic F.V:   Within normal limits       F. Tone:         Observed  F. Movement:    Not Observed               Score:           6/8  F. Breathing:   Observed ---------------------------------------------------------------------- Gestational Age (Fetus B)  Clinical EDD:  34w 0d                                        EDD:   07/28/21  Best:          34w 0d     Det. By:  Clinical EDD             EDD:   07/28/21 ---------------------------------------------------------------------- Anatomy (Fetus B)  Diaphragm:             Appears normal         Kidneys:                Appear normal  Stomach:               Appears normal, left   Bladder:                Appears normal                         sided ---------------------------------------------------------------------- Cervix Uterus Adnexa  Cervix  Not visualized (advanced GA >24wks) ---------------------------------------------------------------------- Comments  This patient presented for repeat fetal testing as her  nonstress test was nonreactive yesterday.  Twin A: Biophysical profile 8 out of 8. Normal amniotic fluid.  Twin B: Biophysical profile 6 out of 8.  Twin B received a -2  for fetal breathing movements that did not meet criteria.  She should have a nonstress test performed as part of the  fetal testing today. ----------------------------------------------------------------------  Ma Rings, MD Electronically Signed Final Report   06/16/2021 04:53 pm ----------------------------------------------------------------------  US Fetal BPP WO NST Addl Gestation  Result Date: 06/16/2021 ----------------------------------------------------------------------  OBSTETRICS REPORT                       (Signed Final 06/16/2021 04:53 pm) ---------------------------------------------------------------------- Patient Info  ID #:       [id number]                          D.O.B.:  October 29, 1986 (35 yrs)  Name:       Makayla Kaiser           Visit Date:  06/16/2021 12:47 pm ---------------------------------------------------------------------- Performed By  Attending:        Ma Rings MD         Referred By:       Marylen Ponto  Performed By:     Birdena Crandall        [facility] ---------------------------------------------------------------------- Orders  #  Description                           Code        Ordered By  1  [place] MFM FETAL BPP WO NON               76819.01    Aleeah Greeno     STRESS  2  [place] MFM FETAL BPP WO NST               40981.1     Quentin Shorey     ADDL GESTATION ----------------------------------------------------------------------  #  Order #                     Accession #                Episode #  1  [id number]                   [id number]                 [id number]  2  [id number]                   [id number]                 [id number] ---------------------------------------------------------------------- Indications  Decreased fetal movements, third trimester,     O36.8130  unspecified  Non-reactive NST                                O28.9  Twin pregnancy, di/di, third trimester          O30.043  Abdominal pain in pregnancy                     O99.89  Medical complication of pregnancy (uterine      O26.90  window)  Advanced maternal age multigravida 35+,  O53.664  third trimester (37)  [redacted] weeks gestation of pregnancy                 Z3A.34 ---------------------------------------------------------------------- Fetal Evaluation (Fetus A)  Num Of Fetuses:          2  Fetal Heart Rate(bpm):   136  Cardiac Activity:        Observed  Fetal Lie:               Maternal right side, Lower  Presentation:            Breech  Placenta:                Posterior  Membrane Desc:      Dividing Membrane seen  Amniotic Fluid  AFI FV:      Within normal limits                              Largest Pocket(cm)                              2.4  ---------------------------------------------------------------------- Biophysical Evaluation (Fetus A)  Amniotic F.V:   Within normal limits       F. Tone:         Observed  F. Movement:    Observed                   Score:           8/8  F. Breathing:   Observed ---------------------------------------------------------------------- Gestational Age (Fetus A)  Clinical EDD:  34w 0d                                        EDD:   07/28/21  Best:          34w 0d     Det. By:  Clinical EDD             EDD:   07/28/21 ---------------------------------------------------------------------- Anatomy (Fetus A)  Stomach:               Appears normal, left   Bladder:                Appears normal                         sided  Kidneys:               Appear normal ---------------------------------------------------------------------- Fetal Evaluation (Fetus B)  Num Of Fetuses:          2  Fetal Heart Rate(bpm):   131  Cardiac Activity:        Observed  Fetal Lie:               Maternal left side, Upper  Presentation:            Breech  Placenta:                Anterior  Membrane Desc:      Dividing Membrane seen  Amniotic Fluid  AFI FV:      Within normal limits  Largest Pocket(cm)                              3.3 ---------------------------------------------------------------------- Biophysical Evaluation (Fetus B)  Amniotic F.V:   Within normal limits       F. Tone:         Observed  F. Movement:    Not Observed               Score:           6/8  F. Breathing:   Observed ---------------------------------------------------------------------- Gestational Age (Fetus B)  Clinical EDD:  34w 0d                                        EDD:   07/28/21  Best:          34w 0d     Det. By:  Clinical EDD             EDD:   07/28/21 ---------------------------------------------------------------------- Anatomy (Fetus B)  Diaphragm:             Appears normal         Kidneys:                Appear normal  Stomach:                Appears normal, left   Bladder:                Appears normal                         sided ---------------------------------------------------------------------- Cervix Uterus Adnexa  Cervix  Not visualized (advanced GA >24wks) ---------------------------------------------------------------------- Comments  This patient presented for repeat fetal testing as her  nonstress test was nonreactive yesterday.  Twin A: Biophysical profile 8 out of 8. Normal amniotic fluid.  Twin B: Biophysical profile 6 out of 8.  Twin B received a -2  for fetal breathing movements that did not meet criteria.  She should have a nonstress test performed as part of the  fetal testing today. ----------------------------------------------------------------------                   Ma Rings, MD Electronically Signed Final Report   06/16/2021 04:53 pm ----------------------------------------------------------------------   MDM NST reactive x2  [place] MFM BPP  Consulted with Dr. Macon Large- Reassuring fetal testing for gestational age. Will have patient follow up in office on Monday as scheduled.  Assessment and Plan   1. NST (non-stress test) reactive on fetal surveillance   2. Decreased fetal movement   3. Dichorionic diamniotic twin pregnancy in third trimester   4. [redacted] weeks gestation of pregnancy    -Discharge home in stable condition -Fetal kick count precautions discussed -Patient advised to follow-up with OB as scheduled on Monday for prenatal care -Patient may return to MAU as needed or if her condition were to change or worsen   Rolm Bookbinder CNM 06/16/2021, 10:54 AM

## 2021-06-18 ENCOUNTER — Ambulatory Visit (HOSPITAL_BASED_OUTPATIENT_CLINIC_OR_DEPARTMENT_OTHER): Payer: Commercial Managed Care - PPO | Admitting: *Deleted

## 2021-06-18 ENCOUNTER — Ambulatory Visit: Payer: Commercial Managed Care - PPO | Admitting: *Deleted

## 2021-06-18 ENCOUNTER — Encounter: Payer: Self-pay | Admitting: *Deleted

## 2021-06-18 ENCOUNTER — Other Ambulatory Visit: Payer: Self-pay

## 2021-06-18 VITALS — BP 137/76 | HR 87

## 2021-06-18 DIAGNOSIS — O0993 Supervision of high risk pregnancy, unspecified, third trimester: Secondary | ICD-10-CM

## 2021-06-18 DIAGNOSIS — O30043 Twin pregnancy, dichorionic/diamniotic, third trimester: Secondary | ICD-10-CM

## 2021-06-18 DIAGNOSIS — Z98891 History of uterine scar from previous surgery: Secondary | ICD-10-CM

## 2021-06-18 DIAGNOSIS — Z3A34 34 weeks gestation of pregnancy: Secondary | ICD-10-CM | POA: Diagnosis not present

## 2021-06-18 DIAGNOSIS — O09523 Supervision of elderly multigravida, third trimester: Secondary | ICD-10-CM

## 2021-06-18 NOTE — Procedures (Signed)
Makayla Kaiser 11/14/1985 [redacted]w[redacted]d  Fetus A Non-Stress Test Interpretation for 06/18/21  Indication:  AMA , Di/Di twins  Fetal Heart Rate A Mode: External Baseline Rate (A): 140 bpm Variability: Moderate Accelerations: 15 x 15 Decelerations: None Multiple birth?: Yes  Uterine Activity Mode: Palpation, Toco Contraction Frequency (min): UI Contraction Quality: Mild Resting Tone Palpated: Relaxed Resting Time: Adequate  Interpretation (Fetal Testing) Nonstress Test Interpretation: Reactive Comments: Dr. Parke Poisson reviewed tracing.  Makayla Kaiser 1985-12-30 [redacted]w[redacted]d   Fetus B Non-Stress Test Interpretation for 06/18/21  Indication:  AMA, Di/Di  twins  Fetal Heart Rate Fetus B Mode: External Baseline Rate (B): 145 BPM Variability: Moderate Accelerations: 15 x 15 Decelerations: None  Uterine Activity Mode: Palpation, Toco Contraction Frequency (min): UI Contraction Quality: Mild Resting Tone Palpated: Relaxed Resting Time: Adequate  Interpretation (Baby B - Fetal Testing) Nonstress Test Interpretation (Baby B): Reactive Comments (Baby B): Dr. Parke Poisson reviewed tracing

## 2021-06-19 ENCOUNTER — Encounter (HOSPITAL_COMMUNITY): Payer: Self-pay

## 2021-06-19 NOTE — Patient Instructions (Signed)
Makayla Kaiser  06/19/2021   Your procedure is scheduled on:  07/03/2021  Arrive at 0800 at Entrance C on [address] at [address] [facility]  and [employer]. You are invited to use the FREE valet parking or use the Visitor's parking deck.  Pick up the phone at the desk and dial [phone].  Call this number if you have problems the morning of surgery: [phone]  Remember:   Do not eat food:(After Midnight) Desps de medianoche.  Do not drink clear liquids: (After Midnight) Desps de medianoche.  Take these medicines the morning of surgery with A SIP OF WATER:  MAY TAKE PEPCID AS PRESCRIBED   Do not wear jewelry, make-up or nail polish.  Do not wear lotions, powders, or perfumes. Do not wear deodorant.  Do not shave 48 hours prior to surgery.  Do not bring valuables to the hospital.  [facility] is not   responsible for any belongings or valuables brought to the hospital.  Contacts, dentures or bridgework may not be worn into surgery.  Leave suitcase in the car. After surgery it may be brought to your room.  For patients admitted to the hospital, checkout time is 11:00 AM the day of              discharge.      Please read over the following fact sheets that you were given:     Preparing for Surgery

## 2021-06-22 ENCOUNTER — Ambulatory Visit: Payer: Commercial Managed Care - PPO | Admitting: *Deleted

## 2021-06-22 ENCOUNTER — Other Ambulatory Visit: Payer: Self-pay

## 2021-06-22 ENCOUNTER — Encounter (INDEPENDENT_AMBULATORY_CARE_PROVIDER_SITE_OTHER): Payer: Self-pay

## 2021-06-22 ENCOUNTER — Ambulatory Visit: Payer: Commercial Managed Care - PPO | Attending: Obstetrics

## 2021-06-22 ENCOUNTER — Encounter: Payer: Self-pay | Admitting: *Deleted

## 2021-06-22 VITALS — BP 132/79 | HR 90

## 2021-06-22 DIAGNOSIS — O30043 Twin pregnancy, dichorionic/diamniotic, third trimester: Secondary | ICD-10-CM

## 2021-06-22 DIAGNOSIS — O09523 Supervision of elderly multigravida, third trimester: Secondary | ICD-10-CM

## 2021-06-22 DIAGNOSIS — O26893 Other specified pregnancy related conditions, third trimester: Secondary | ICD-10-CM | POA: Diagnosis not present

## 2021-06-22 DIAGNOSIS — O99891 Other specified diseases and conditions complicating pregnancy: Secondary | ICD-10-CM

## 2021-06-22 DIAGNOSIS — Z3A34 34 weeks gestation of pregnancy: Secondary | ICD-10-CM

## 2021-06-22 DIAGNOSIS — N858 Other specified noninflammatory disorders of uterus: Secondary | ICD-10-CM

## 2021-06-22 DIAGNOSIS — R109 Unspecified abdominal pain: Secondary | ICD-10-CM | POA: Diagnosis not present

## 2021-06-25 ENCOUNTER — Ambulatory Visit: Payer: Commercial Managed Care - PPO | Admitting: *Deleted

## 2021-06-25 ENCOUNTER — Ambulatory Visit: Payer: Commercial Managed Care - PPO | Attending: Obstetrics and Gynecology | Admitting: *Deleted

## 2021-06-25 ENCOUNTER — Other Ambulatory Visit: Payer: Self-pay

## 2021-06-25 VITALS — BP 131/74 | HR 90

## 2021-06-25 DIAGNOSIS — O09523 Supervision of elderly multigravida, third trimester: Secondary | ICD-10-CM

## 2021-06-25 DIAGNOSIS — Z3A35 35 weeks gestation of pregnancy: Secondary | ICD-10-CM

## 2021-06-25 DIAGNOSIS — O34593 Maternal care for other abnormalities of gravid uterus, third trimester: Secondary | ICD-10-CM

## 2021-06-25 DIAGNOSIS — O30043 Twin pregnancy, dichorionic/diamniotic, third trimester: Secondary | ICD-10-CM | POA: Insufficient documentation

## 2021-06-25 LAB — OB RESULTS CONSOLE GBS: GBS: NEGATIVE

## 2021-06-25 NOTE — Procedures (Signed)
Makayla Kaiser 07/01/1986 [redacted]w[redacted]d   Fetus B Non-Stress Test Interpretation for 06/25/21  Indication:  di di twins, AMA, uterine window  Fetal Heart Rate Fetus B Mode: External Baseline Rate (B): 140 BPM Variability: Moderate Accelerations: 15 x 15 Decelerations: None  Uterine Activity Mode: Palpation Contraction Frequency (min): ui Contraction Duration (sec): 10-20 Contraction Quality: Mild Resting Tone Palpated: Relaxed Resting Time: Adequate  Interpretation (Baby B - Fetal Testing) Nonstress Test Interpretation (Baby B): Reactive Overall Impression (Baby B): Reassuring for gestational age Comments (Baby B): Dr. Grace Bushy reviewed tracing  Makayla Kaiser 16-May-1986 [redacted]w[redacted]d  Fetus A Non-Stress Test Interpretation for 06/25/21  Indication:  di di twins, AMA, uterine window  Fetal Heart Rate A Mode: External Baseline Rate (A): 130 bpm Variability: Moderate Accelerations: 15 x 15 Decelerations: None Multiple birth?: Yes  Uterine Activity Mode: Palpation Contraction Frequency (min): ui Contraction Duration (sec): 10-20 Contraction Quality: Mild Resting Tone Palpated: Relaxed Resting Time: Adequate  Interpretation (Fetal Testing) Nonstress Test Interpretation: Reactive Overall Impression: Reassuring for gestational age Comments: Dr. Grace Bushy reviewed tracing

## 2021-06-28 ENCOUNTER — Ambulatory Visit: Payer: Commercial Managed Care - PPO | Admitting: *Deleted

## 2021-06-28 ENCOUNTER — Ambulatory Visit: Payer: Commercial Managed Care - PPO | Attending: Obstetrics and Gynecology

## 2021-06-28 ENCOUNTER — Other Ambulatory Visit: Payer: Self-pay

## 2021-06-28 VITALS — BP 124/78 | HR 82

## 2021-06-28 DIAGNOSIS — O30043 Twin pregnancy, dichorionic/diamniotic, third trimester: Secondary | ICD-10-CM | POA: Insufficient documentation

## 2021-06-28 DIAGNOSIS — O09523 Supervision of elderly multigravida, third trimester: Secondary | ICD-10-CM | POA: Diagnosis not present

## 2021-06-28 DIAGNOSIS — O321XX1 Maternal care for breech presentation, fetus 1: Secondary | ICD-10-CM

## 2021-06-28 DIAGNOSIS — O34593 Maternal care for other abnormalities of gravid uterus, third trimester: Secondary | ICD-10-CM | POA: Diagnosis not present

## 2021-06-28 DIAGNOSIS — Z3A35 35 weeks gestation of pregnancy: Secondary | ICD-10-CM

## 2021-06-28 DIAGNOSIS — O321XX2 Maternal care for breech presentation, fetus 2: Secondary | ICD-10-CM

## 2021-07-02 ENCOUNTER — Encounter (HOSPITAL_COMMUNITY)
Admission: RE | Admit: 2021-07-02 | Discharge: 2021-07-02 | Disposition: A | Discharge status: Home / Self Care | Payer: Commercial Managed Care - PPO | Source: Ambulatory Visit | Attending: Obstetrics and Gynecology | Admitting: Obstetrics and Gynecology

## 2021-07-02 ENCOUNTER — Other Ambulatory Visit: Payer: Self-pay

## 2021-07-02 ENCOUNTER — Encounter (HOSPITAL_COMMUNITY): Payer: Self-pay | Admitting: Obstetrics and Gynecology

## 2021-07-02 ENCOUNTER — Other Ambulatory Visit: Payer: Self-pay | Admitting: Obstetrics and Gynecology

## 2021-07-02 DIAGNOSIS — Z01812 Encounter for preprocedural laboratory examination: Secondary | ICD-10-CM | POA: Insufficient documentation

## 2021-07-02 LAB — CBC
HCT: 34.4 % — ABNORMAL LOW (ref 36.0–46.0)
Hemoglobin: 11 g/dL — ABNORMAL LOW (ref 12.0–15.0)
MCH: 28.2 pg (ref 26.0–34.0)
MCHC: 32 g/dL (ref 30.0–36.0)
MCV: 88.2 fL (ref 80.0–100.0)
Platelets: 232 10*3/uL (ref 150–400)
RBC: 3.9 MIL/uL (ref 3.87–5.11)
RDW: 12.6 % (ref 11.5–15.5)
WBC: 7.9 10*3/uL (ref 4.0–10.5)
nRBC: 0 % (ref 0.0–0.2)

## 2021-07-02 LAB — TYPE AND SCREEN
ABO/RH(D): B POS
Antibody Screen: NEGATIVE

## 2021-07-02 LAB — SARS CORONAVIRUS 2 (TAT 6-24 HRS): SARS Coronavirus 2: NEGATIVE

## 2021-07-02 NOTE — Anesthesia Preprocedure Evaluation (Addendum)
Anesthesia Evaluation  Patient identified by MRN, date of birth, ID band Patient awake    Reviewed: Allergy & Precautions, NPO status , Patient's Chart, lab work & pertinent test results  Airway Mallampati: II  TM Distance: >3 FB Neck ROM: Full    Dental no notable dental hx. (+) Teeth Intact, Dental Advisory Given   Pulmonary asthma ,    Pulmonary exam normal breath sounds clear to auscultation       Cardiovascular Exercise Tolerance: Good Normal cardiovascular exam Rhythm:Regular Rate:Normal     Neuro/Psych negative neurological ROS  negative psych ROS   GI/Hepatic negative GI ROS, Neg liver ROS,   Endo/Other  negative endocrine ROS  Renal/GU negative Renal ROS     Musculoskeletal negative musculoskeletal ROS (+)   Abdominal (+) + obese (BMI 38.53),   Peds  Hematology  (+) Blood dyscrasia (Factor II deficiency), , Lab Results      Component                Value               Date                      WBC                      7.9                 07/02/2021                HGB                      11.0 (L)            07/02/2021                HCT                      34.4 (L)            07/02/2021                MCV                      88.2                07/02/2021                PLT                      232                 07/02/2021            TXS available   Anesthesia Other Findings   Reproductive/Obstetrics (+) Pregnancy                            Anesthesia Physical Anesthesia Plan  ASA: 3  Anesthesia Plan: Spinal and Combined Spinal and Epidural   Post-op Pain Management:    Induction:   PONV Risk Score and Plan: 3 and Treatment may vary due to age or medical condition  Airway Management Planned: Natural Airway and Nasal Cannula  Additional Equipment: None  Intra-op Plan:   Post-operative Plan:   Informed Consent: I have reviewed the patients History and  Physical, chart, labs and discussed the procedure including the risks, benefits and  alternatives for the proposed anesthesia with the patient or authorized representative who has indicated his/her understanding and acceptance.       Plan Discussed with: CRNA  Anesthesia Plan Comments: (35.3 wk G4P2  Twin Gestation  For Repeat c/S X3 w  BTL)       Anesthesia Quick Evaluation

## 2021-07-02 NOTE — H&P (Signed)
Makayla Kaiser is a 35 y.o. female presenting for repeat cesarean section and bilateral tubal ligation. Pregnancy complicated by Di/Di twin gestation that were breech/breech and BPP 8/8 X 2 on U/S in office of 06/28/21. At her second C/S a very thin uterine window was noted. Dr Parke Poisson, MFM, recommends delivery at 36 weeks. She also requests BTL. Pregnancy also complicated by Factor II deficiency and Hx of DVT on OCPs. She has been on Lovenox 40mg  qd with last dose 3 days ago. Also AMA> Panorama normal boy/normal girl. ADHD with Adderall 1/2 tab in first trimester. OB History     Gravida  4   Para  1   Term  1   Preterm      AB  1   Living  2      SAB  1   IAB      Ectopic      Multiple  0   Live Births  2          Past Medical History:  Diagnosis Date   Asthma    Factor II deficiency (HCC)    History of DVT (deep vein thrombosis)    Past Surgical History:  Procedure Laterality Date   APPENDECTOMY     CESAREAN SECTION     CESAREAN SECTION N/A 01/20/2019   Procedure: CESAREAN SECTION;  Surgeon: 03/22/2019, MD;  Location: [facility] LD ORS;  Service: Obstetrics;  Laterality: N/A;  Repeat edc 02/02/19 NKDA Tracey RNFA   TONSILLECTOMY     Family History: family history includes Clotting disorder in her sister; Factor V Leiden deficiency in her mother and sister; Heart disease in her mother; Hypothyroidism in her mother. Social History:  reports that she has never smoked. She has never used smokeless tobacco. She reports that she does not currently use alcohol. She reports that she does not use drugs.     Maternal Diabetes: No Genetic Screening: Normal Maternal Ultrasounds/Referrals: Normal Fetal Ultrasounds or other Referrals:  None Maternal Substance Abuse:  No Significant Maternal Medications:  Meds include: Other: Lovenox>stopped Significant Maternal Lab Results:  Group B Strep negative Other Comments:  None  Review of Systems  Constitutional:  Negative  for fever.  Eyes:  Negative for visual disturbance.  Gastrointestinal:  Negative for abdominal pain.  Neurological:  Negative for headaches.  Maternal Medical History:  Fetal activity: Perceived fetal activity is normal.      There were no vitals taken for this visit. Maternal Exam:  Abdomen: Fetal presentation: breech  Physical Exam Cardiovascular:     Rate and Rhythm: Normal rate.  Pulmonary:     Effort: Pulmonary effort is normal.    Prenatal labs: ABO, Rh: --/--/B POS (08/22 0930) Antibody: NEG (08/22 0930) Rubella: Immune (02/10 0000) RPR: Nonreactive (02/10 0000)  HBsAg: Negative (02/10 0000)  HIV: Non-reactive (02/10 0000)  GBS:   negative  06/25/21  Assessment/Plan: 35 yo G4P2 for repeat C/S with BTL Risks reviewed including infection, organ damage, bleeding/transfusion HIV/Hep, DVT/PE, pneumonia. BTL with permanence, alternatives, failure rate and increased ectopic risk. She states she understands and agrees.    30 II 07/02/2021, 5:14 PM

## 2021-07-03 ENCOUNTER — Inpatient Hospital Stay (HOSPITAL_COMMUNITY)
Admission: RE | Admit: 2021-07-03 | Discharge: 2021-07-05 | DRG: 784 | Disposition: A | Discharge status: Home / Self Care | Payer: Commercial Managed Care - PPO | Attending: Obstetrics and Gynecology | Admitting: Obstetrics and Gynecology

## 2021-07-03 ENCOUNTER — Other Ambulatory Visit: Payer: Self-pay

## 2021-07-03 ENCOUNTER — Inpatient Hospital Stay (HOSPITAL_COMMUNITY): Payer: Commercial Managed Care - PPO | Admitting: Anesthesiology

## 2021-07-03 ENCOUNTER — Encounter (HOSPITAL_COMMUNITY)
Admission: RE | Disposition: A | Discharge status: Home / Self Care | Payer: Self-pay | Source: Home / Self Care | Attending: Obstetrics and Gynecology

## 2021-07-03 ENCOUNTER — Encounter (HOSPITAL_COMMUNITY): Payer: Self-pay | Admitting: Obstetrics and Gynecology

## 2021-07-03 DIAGNOSIS — D62 Acute posthemorrhagic anemia: Secondary | ICD-10-CM | POA: Diagnosis not present

## 2021-07-03 DIAGNOSIS — O9912 Other diseases of the blood and blood-forming organs and certain disorders involving the immune mechanism complicating childbirth: Secondary | ICD-10-CM | POA: Diagnosis present

## 2021-07-03 DIAGNOSIS — D682 Hereditary deficiency of other clotting factors: Secondary | ICD-10-CM | POA: Diagnosis present

## 2021-07-03 DIAGNOSIS — O321XX1 Maternal care for breech presentation, fetus 1: Secondary | ICD-10-CM | POA: Diagnosis present

## 2021-07-03 DIAGNOSIS — O321XX2 Maternal care for breech presentation, fetus 2: Secondary | ICD-10-CM | POA: Diagnosis present

## 2021-07-03 DIAGNOSIS — Z302 Encounter for sterilization: Secondary | ICD-10-CM | POA: Diagnosis not present

## 2021-07-03 DIAGNOSIS — O34211 Maternal care for low transverse scar from previous cesarean delivery: Secondary | ICD-10-CM | POA: Diagnosis present

## 2021-07-03 DIAGNOSIS — Z7901 Long term (current) use of anticoagulants: Secondary | ICD-10-CM | POA: Diagnosis not present

## 2021-07-03 DIAGNOSIS — Z3A36 36 weeks gestation of pregnancy: Secondary | ICD-10-CM | POA: Diagnosis not present

## 2021-07-03 DIAGNOSIS — O9081 Anemia of the puerperium: Secondary | ICD-10-CM | POA: Diagnosis not present

## 2021-07-03 DIAGNOSIS — O30043 Twin pregnancy, dichorionic/diamniotic, third trimester: Secondary | ICD-10-CM | POA: Diagnosis present

## 2021-07-03 DIAGNOSIS — N85A Isthmocele: Secondary | ICD-10-CM | POA: Diagnosis present

## 2021-07-03 DIAGNOSIS — Z86718 Personal history of other venous thrombosis and embolism: Secondary | ICD-10-CM

## 2021-07-03 DIAGNOSIS — O34219 Maternal care for unspecified type scar from previous cesarean delivery: Secondary | ICD-10-CM | POA: Diagnosis present

## 2021-07-03 LAB — SYPHILIS: RPR W/REFLEX TO RPR TITER AND TREPONEMAL ANTIBODIES, TRADITIONAL SCREENING AND DIAGNOSIS ALGORITHM: RPR Ser Ql: NONREACTIVE

## 2021-07-03 SURGERY — Surgical Case
Anesthesia: Spinal

## 2021-07-03 MED ORDER — SOD CITRATE-CITRIC ACID 500-334 MG/5ML PO SOLN
ORAL | Status: AC
  Filled 2021-07-03: qty 30

## 2021-07-03 MED ORDER — ONDANSETRON HCL 4 MG PO TABS
8.0000 mg | ORAL_TABLET | Freq: Two times a day (BID) | ORAL | Status: DC
  Administered 2021-07-03 – 2021-07-05 (×4): 8 mg via ORAL
  Filled 2021-07-03 (×4): qty 2

## 2021-07-03 MED ORDER — BUPIVACAINE IN DEXTROSE 0.75-8.25 % IT SOLN
INTRATHECAL | Status: AC
  Filled 2021-07-03: qty 2

## 2021-07-03 MED ORDER — STERILE WATER FOR IRRIGATION IR SOLN
Status: DC | PRN
  Administered 2021-07-03: 1

## 2021-07-03 MED ORDER — OXYTOCIN-SODIUM CHLORIDE 30-0.9 UT/500ML-% IV SOLN: 2.5000 [IU]/h | INTRAVENOUS | Status: AC

## 2021-07-03 MED ORDER — ONDANSETRON HCL 4 MG/2ML IJ SOLN
4.0000 mg | Freq: Once | INTRAMUSCULAR | Status: AC | PRN
  Administered 2021-07-03: 4 mg via INTRAVENOUS

## 2021-07-03 MED ORDER — TETANUS-DIPHTH-ACELL PERTUSSIS 5-2.5-18.5 LF-MCG/0.5 IM SUSY
0.5000 mL | PREFILLED_SYRINGE | Freq: Once | INTRAMUSCULAR | Status: DC
Start: 2021-07-04 — End: 2021-07-06

## 2021-07-03 MED ORDER — ENOXAPARIN SODIUM 60 MG/0.6ML IJ SOSY
50.0000 mg | PREFILLED_SYRINGE | INTRAMUSCULAR | Status: DC
  Administered 2021-07-04 – 2021-07-05 (×2): 50 mg via SUBCUTANEOUS
  Filled 2021-07-03 (×2): qty 0.6

## 2021-07-03 MED ORDER — OXYTOCIN-SODIUM CHLORIDE 30-0.9 UT/500ML-% IV SOLN
INTRAVENOUS | Status: DC | PRN
  Administered 2021-07-03: 30 [IU] via INTRAVENOUS

## 2021-07-03 MED ORDER — SCOPOLAMINE 1 MG/3DAYS TD PT72
1.0000 | MEDICATED_PATCH | TRANSDERMAL | Status: DC
  Administered 2021-07-03: 1.5 mg via TRANSDERMAL

## 2021-07-03 MED ORDER — BUPIVACAINE IN DEXTROSE 0.75-8.25 % IT SOLN
INTRATHECAL | Status: DC | PRN
  Administered 2021-07-03: 12.75 mg via INTRATHECAL

## 2021-07-03 MED ORDER — LACTATED RINGERS IV SOLN: INTRAVENOUS | Status: DC

## 2021-07-03 MED ORDER — MORPHINE SULFATE (PF) 0.5 MG/ML IJ SOLN
INTRAMUSCULAR | Status: AC
  Filled 2021-07-03: qty 10

## 2021-07-03 MED ORDER — SIMETHICONE 80 MG PO CHEW
80.0000 mg | CHEWABLE_TABLET | Freq: Three times a day (TID) | ORAL | Status: DC
  Administered 2021-07-04 – 2021-07-05 (×5): 80 mg via ORAL
  Filled 2021-07-03 (×5): qty 1

## 2021-07-03 MED ORDER — ONDANSETRON HCL 4 MG/2ML IJ SOLN
INTRAMUSCULAR | Status: AC
  Filled 2021-07-03: qty 2

## 2021-07-03 MED ORDER — FENTANYL CITRATE (PF) 100 MCG/2ML IJ SOLN
INTRAMUSCULAR | Status: DC | PRN
  Administered 2021-07-03: 15 ug via INTRATHECAL

## 2021-07-03 MED ORDER — HYDROMORPHONE HCL 1 MG/ML IJ SOLN: 0.2000 mg | INTRAMUSCULAR | Status: DC | PRN

## 2021-07-03 MED ORDER — SODIUM CHLORIDE 0.9 % IR SOLN
Status: DC | PRN
  Administered 2021-07-03: 1

## 2021-07-03 MED ORDER — SCOPOLAMINE 1 MG/3DAYS TD PT72
MEDICATED_PATCH | TRANSDERMAL | Status: AC
  Filled 2021-07-03: qty 1

## 2021-07-03 MED ORDER — SENNOSIDES-DOCUSATE SODIUM 8.6-50 MG PO TABS
2.0000 | ORAL_TABLET | Freq: Every day | ORAL | Status: DC
  Administered 2021-07-04 – 2021-07-05 (×2): 2 via ORAL
  Filled 2021-07-03 (×2): qty 2

## 2021-07-03 MED ORDER — OXYCODONE HCL 5 MG/5ML PO SOLN: 5.0000 mg | Freq: Once | ORAL | Status: DC | PRN

## 2021-07-03 MED ORDER — FENTANYL CITRATE (PF) 100 MCG/2ML IJ SOLN
INTRAMUSCULAR | Status: AC
  Filled 2021-07-03: qty 2

## 2021-07-03 MED ORDER — PRENATAL MULTIVITAMIN CH
1.0000 | ORAL_TABLET | Freq: Every day | ORAL | Status: DC
  Administered 2021-07-04 – 2021-07-05 (×2): 1 via ORAL
  Filled 2021-07-03 (×2): qty 1

## 2021-07-03 MED ORDER — DIBUCAINE (PERIANAL) 1 % EX OINT: 1.0000 "application " | TOPICAL_OINTMENT | CUTANEOUS | Status: DC | PRN

## 2021-07-03 MED ORDER — POVIDONE-IODINE 10 % EX SWAB
2.0000 "application " | Freq: Once | CUTANEOUS | Status: AC
  Administered 2021-07-03: 2 via TOPICAL

## 2021-07-03 MED ORDER — DIPHENHYDRAMINE HCL 25 MG PO CAPS: 25.0000 mg | ORAL_CAPSULE | Freq: Four times a day (QID) | ORAL | Status: DC | PRN

## 2021-07-03 MED ORDER — KETOROLAC TROMETHAMINE 30 MG/ML IJ SOLN: 30.0000 mg | Freq: Once | INTRAMUSCULAR | Status: DC | PRN

## 2021-07-03 MED ORDER — ZOLPIDEM TARTRATE 5 MG PO TABS: 5.0000 mg | ORAL_TABLET | Freq: Every evening | ORAL | Status: DC | PRN

## 2021-07-03 MED ORDER — MORPHINE SULFATE (PF) 0.5 MG/ML IJ SOLN
INTRAMUSCULAR | Status: DC | PRN
  Administered 2021-07-03: 150 ug via INTRATHECAL

## 2021-07-03 MED ORDER — OXYCODONE HCL 5 MG PO TABS
5.0000 mg | ORAL_TABLET | ORAL | Status: DC | PRN
  Administered 2021-07-04: 5 mg via ORAL
  Administered 2021-07-04 – 2021-07-05 (×3): 10 mg via ORAL
  Filled 2021-07-03: qty 2
  Filled 2021-07-03: qty 1
  Filled 2021-07-03 (×2): qty 2

## 2021-07-03 MED ORDER — CEFAZOLIN SODIUM-DEXTROSE 2-4 GM/100ML-% IV SOLN
2.0000 g | INTRAVENOUS | Status: AC
  Administered 2021-07-03: 2 g via INTRAVENOUS

## 2021-07-03 MED ORDER — PROMETHAZINE HCL 25 MG PO TABS: 25.0000 mg | ORAL_TABLET | Freq: Three times a day (TID) | ORAL | Status: DC | PRN

## 2021-07-03 MED ORDER — MENTHOL 3 MG MT LOZG: 1.0000 | LOZENGE | OROMUCOSAL | Status: DC | PRN

## 2021-07-03 MED ORDER — SIMETHICONE 80 MG PO CHEW: 80.0000 mg | CHEWABLE_TABLET | ORAL | Status: DC | PRN

## 2021-07-03 MED ORDER — OXYCODONE HCL 5 MG PO TABS: 5.0000 mg | ORAL_TABLET | Freq: Once | ORAL | Status: DC | PRN

## 2021-07-03 MED ORDER — PHENYLEPHRINE HCL-NACL 20-0.9 MG/250ML-% IV SOLN
INTRAVENOUS | Status: DC | PRN
  Administered 2021-07-03: 60 ug/min via INTRAVENOUS

## 2021-07-03 MED ORDER — COCONUT OIL OIL: 1.0000 "application " | TOPICAL_OIL | Status: DC | PRN

## 2021-07-03 MED ORDER — SOD CITRATE-CITRIC ACID 500-334 MG/5ML PO SOLN
30.0000 mL | ORAL | Status: AC
  Administered 2021-07-03: 30 mL via ORAL

## 2021-07-03 MED ORDER — CEFAZOLIN SODIUM-DEXTROSE 2-4 GM/100ML-% IV SOLN
INTRAVENOUS | Status: AC
  Filled 2021-07-03: qty 100

## 2021-07-03 MED ORDER — HYDROMORPHONE HCL 1 MG/ML IJ SOLN: 0.2500 mg | INTRAMUSCULAR | Status: DC | PRN

## 2021-07-03 MED ORDER — ACETAMINOPHEN 500 MG PO TABS
1000.0000 mg | ORAL_TABLET | Freq: Four times a day (QID) | ORAL | Status: DC
  Administered 2021-07-03 – 2021-07-05 (×8): 1000 mg via ORAL
  Filled 2021-07-03 (×9): qty 2

## 2021-07-03 MED ORDER — WITCH HAZEL-GLYCERIN EX PADS: 1.0000 "application " | MEDICATED_PAD | CUTANEOUS | Status: DC | PRN

## 2021-07-03 MED ORDER — ONDANSETRON HCL 4 MG/2ML IJ SOLN
INTRAMUSCULAR | Status: DC | PRN
Start: 2021-07-03 — End: 2021-07-03
  Administered 2021-07-03: 4 mg via INTRAVENOUS

## 2021-07-03 MED ORDER — PHENYLEPHRINE HCL-NACL 20-0.9 MG/250ML-% IV SOLN
INTRAVENOUS | Status: AC
  Filled 2021-07-03: qty 250

## 2021-07-03 SURGICAL SUPPLY — 36 items
BENZOIN TINCTURE PRP APPL 2/3 (GAUZE/BANDAGES/DRESSINGS) ×2 IMPLANT
CLAMP CORD UMBIL (MISCELLANEOUS) IMPLANT
CLOTH BEACON ORANGE TIMEOUT ST (SAFETY) ×2 IMPLANT
DERMABOND ADVANCED (GAUZE/BANDAGES/DRESSINGS)
DERMABOND ADVANCED .7 DNX12 (GAUZE/BANDAGES/DRESSINGS) IMPLANT
DRSG OPSITE POSTOP 4X10 (GAUZE/BANDAGES/DRESSINGS) ×2 IMPLANT
ELECT REM PT RETURN 9FT ADLT (ELECTROSURGICAL) ×2
ELECTRODE REM PT RTRN 9FT ADLT (ELECTROSURGICAL) ×1 IMPLANT
EXTRACTOR VACUUM M CUP 4 TUBE (SUCTIONS) IMPLANT
GLOVE BIO SURGEON STRL SZ7.5 (GLOVE) ×2 IMPLANT
GLOVE BIOGEL PI IND STRL 7.0 (GLOVE) ×1 IMPLANT
GLOVE BIOGEL PI INDICATOR 7.0 (GLOVE) ×1
GOWN STRL REUS W/TWL LRG LVL3 (GOWN DISPOSABLE) ×4 IMPLANT
HOVERMATT SINGLE USE (MISCELLANEOUS) ×2 IMPLANT
KIT ABG SYR 3ML LUER SLIP (SYRINGE) ×2 IMPLANT
NEEDLE HYPO 22GX1.5 SAFETY (NEEDLE) ×2 IMPLANT
NEEDLE HYPO 25X5/8 SAFETYGLIDE (NEEDLE) ×2 IMPLANT
NS IRRIG 1000ML POUR BTL (IV SOLUTION) ×2 IMPLANT
PACK C SECTION WH (CUSTOM PROCEDURE TRAY) ×2 IMPLANT
PAD ABD DERMACEA PRESS 5X9 (GAUZE/BANDAGES/DRESSINGS) ×2 IMPLANT
PAD OB MATERNITY 4.3X12.25 (PERSONAL CARE ITEMS) ×2 IMPLANT
PENCIL SMOKE EVAC W/HOLSTER (ELECTROSURGICAL) ×2 IMPLANT
RETAINER VISCERAL (MISCELLANEOUS) ×2 IMPLANT
SPONGE GAUZE 4X4 12PLY STER LF (GAUZE/BANDAGES/DRESSINGS) ×4 IMPLANT
STRIP CLOSURE SKIN 1/2X4 (GAUZE/BANDAGES/DRESSINGS) ×2 IMPLANT
SUT MNCRL 0 VIOLET CTX 36 (SUTURE) ×4 IMPLANT
SUT MONOCRYL 0 CTX 36 (SUTURE) ×4
SUT PDS AB 0 CTX 60 (SUTURE) ×2 IMPLANT
SUT PLAIN 0 NONE (SUTURE) ×2 IMPLANT
SUT PLAIN 2 0 (SUTURE)
SUT PLAIN 2 0 XLH (SUTURE) ×2 IMPLANT
SUT PLAIN ABS 2-0 CT1 27XMFL (SUTURE) IMPLANT
SUT VIC AB 4-0 KS 27 (SUTURE) ×2 IMPLANT
TOWEL OR 17X24 6PK STRL BLUE (TOWEL DISPOSABLE) ×2 IMPLANT
TRAY FOLEY W/BAG SLVR 14FR LF (SET/KITS/TRAYS/PACK) ×2 IMPLANT
WATER STERILE IRR 1000ML POUR (IV SOLUTION) ×2 IMPLANT

## 2021-07-03 NOTE — Anesthesia Postprocedure Evaluation (Signed)
Anesthesia Post Note  Patient: Makayla Kaiser  Procedure(s) Performed: Multi-Gestational CESAREAN SECTION WITH BILATERAL TUBAL LIGATION EDC: 07/28/2021 ALLERG: NKDA  PREVIOUS X 2     Patient location during evaluation: Mother Baby Anesthesia Type: Spinal Level of consciousness: oriented and awake and alert Pain management: pain level controlled Vital Signs Assessment: post-procedure vital signs reviewed and stable Respiratory status: spontaneous breathing and respiratory function stable Cardiovascular status: blood pressure returned to baseline and stable Postop Assessment: no headache, no backache, no apparent nausea or vomiting and able to ambulate Anesthetic complications: no   No notable events documented.  Last Vitals:  Vitals:   07/03/21 1258 07/03/21 1335  BP:  110/73  Pulse: 77 67  Resp: (!) 22 20  Temp:  36.4 C  SpO2: 96% 97%    Last Pain:  Vitals:   07/03/21 1335  TempSrc: Oral  PainSc: 0-No pain   Pain Goal:                   Trevor Iha

## 2021-07-03 NOTE — Transfer of Care (Signed)
Immediate Anesthesia Transfer of Care Note  Patient: Makayla Kaiser  Procedure(s) Performed: REPEAT CESAREAN SECTION WITH BILATERAL TUBAL LIGATION EDC: 07/28/2021 ALLERG: NKDA  PREVIOUS X 2  Patient Location: PACU  Anesthesia Type:Spinal  Level of Consciousness: awake  Airway & Oxygen Therapy: Patient Spontanous Breathing  Post-op Assessment: Report given to RN and Post -op Vital signs reviewed and stable  Post vital signs: Reviewed and stable  Last Vitals:  Vitals Value Taken Time  BP 114/65 07/03/21 1148  Temp    Pulse 70 07/03/21 1150  Resp 24 07/03/21 1150  SpO2 95 % 07/03/21 1150  Vitals shown include unvalidated device data.  Last Pain:  Vitals:   07/03/21 0903  TempSrc: Oral  PainSc: 0-No pain         Complications: No notable events documented.

## 2021-07-03 NOTE — Brief Op Note (Signed)
07/03/2021  11:31 AM  PATIENT:  Makayla Kaiser  35 y.o. female  PRE-OPERATIVE DIAGNOSIS:  PREVIOUS X 2, HISTORY OF UTERINE WINDOW, FACTOR II DEFICIENCY  POST-OPERATIVE DIAGNOSIS:  PREVIOUS X 2, HISTORY OF UTERINE WINDOW, FACTOR II DEFICIENCY BREECH DI/DI TWINS  PROCEDURE:  Procedure(s): REPEAT CESAREAN SECTION WITH BILATERAL TUBAL LIGATION EDC: 07/28/2021 ALLERG: NKDA  PREVIOUS X 2 (N/A)  SURGEON:  Surgeon(s) and Role:    * Harold Hedge, MD - Primary  PHYSICIAN ASSISTANT:   ASSISTANTS: none   ANESTHESIA:   spinal  EBL:    BLOOD ADMINISTERED:none  DRAINS: Urinary Catheter (Foley)   LOCAL MEDICATIONS USED:  NONE  SPECIMEN:  Source of Specimen:  placentas and bilateral fallopian tube segments  DISPOSITION OF SPECIMEN:  PATHOLOGY  COUNTS:  YES  TOURNIQUET:  * No tourniquets in log *  DICTATION: .Other Dictation: Dictation Number [id number]  PLAN OF CARE: Admit to inpatient   PATIENT DISPOSITION:  PACU - hemodynamically stable.   Delay start of Pharmacological VTE agent (>24hrs) due to surgical blood loss or risk of bleeding: not applicable

## 2021-07-03 NOTE — Op Note (Signed)
Makayla Kaiser, BELLISSIMO MEDICAL RECORD NO: [id number] ACCOUNT NO: [id number] DATE OF BIRTH: 10/18/1986 FACILITY: [facility] LOCATION: [facility] PHYSICIAN: Guy Sandifer. Arleta Creek, MD  Operative Report   DATE OF PROCEDURE: 07/03/2021  PREOPERATIVE DIAGNOSES:   1.  Intrauterine pregnancy at 36-3/7th weeks. 2.  Previous cesarean section x2 with uterine window. 3.  Twins. 4.  Breech-breech presentation. 5.  Factor II deficiency.  POSTOPERATIVE DIAGNOSES: 1.  Intrauterine pregnancy at 36-3/7th weeks. 2.  Previous cesarean section x2 with uterine window. 3.  Twins. 4.  Breech-breech presentation. 5.  Factor II deficiency.  PROCEDURE:  Repeat low transverse cesarean section with bilateral tubal ligation.  ASSISTANT:  Guy Sandifer. Arleta Creek, MD  ANESTHESIA:  Spinal.  ESTIMATED BLOOD LOSS:  Per Anesthesiology note.  FINDINGS:  Viable female and viable female infant, Apgars, arterial cord pH, birth weight pending.  SPECIMENS:  Both placentas and bilateral fallopian tube segments to pathology.  INDICATIONS AND CONSENT:  This patient is a 35 year old G4 P2 at 36-3/7th weeks.  She has been on Lovenox 40 mg daily, which was discontinued three days ago.  She has breech-breech twins and a history of a uterine window at her last cesarean section.   Per MFM recommendation she is being delivered at 36-3/7th weeks.  She also desires permanent sterilization.  Alternative methods of contraception, permanence of the procedure increased ectopic risk and failure rate has been discussed.  Cesarean section  has also been reviewed preoperatively and risks discussed including but not limited to infection, organ damage, bleeding requiring transfusion of blood products with HIV and hepatitis acquisition, DVT, PE, and pneumonia.  The patient states she  understands and agrees and consent is signed and on the chart.  DESCRIPTION OF PROCEDURE:  The patient was taken to the operating room where she was identified.  Spinal  anesthetic was placed and she was placed in the dorsal supine position with a 15-degree left lateral wedge.  She was prepped vaginally with Betadine.   Foley catheter was placed and prepped abdomen with ChloraPrep.  Timeout was undertaken.  After 3-minute drying time, she was draped in a sterile fashion.  After testing for adequate spinal anesthesia skin was entered through the Pfannenstiel scar and  dissection was carried out in layers to the peritoneum, which was extended superiorly and inferiorly.  Vesicouterine peritoneum was taken down cephalad laterally.  Low transverse incision is made and the uterine cavity was entered bluntly with a  hemostat.  The uterine incision was extended with the fingers bilaterally.  Artificial rupture of membranes for clear fluid was done.  Baby A is the female baby delivered from the frank breech position without difficulty.  Good cry and tone was noted and  after 1 minute, cord was clamped and cut and the baby was handed to waiting pediatrics team.  The membranes on baby B is ruptured for clear fluid.  This baby was delivered from the double footling breech position without difficulty.  Good cry and tone is  noted and after 1 minute, the cord was clamped and cut and the baby was handed to awaiting pediatrics team.  Placentas were manually delivered.  Careful inspection reveals a clean uterus.  Uterus was closed in 2 running locking imbricating layers of 0  Monocryl suture, which achieved good hemostasis.  Left fallopian tube was identified from cornu to fimbria and grasped in its mid ampullary portion with a Babcock clamp.  The intervening knuckle of tube was then doubly ligated with 2 free ties of  plain  suture and the knuckle was then sharply resected and sent to pathology.  Cautery was used to assure hemostasis.  Similar procedure was carried on the right side.  A third free tie was placed on the right side as well to ensure hemostasis.  Lavage was  carried out and all  returned as clear.  Anterior peritoneum was closed in running fashion with 0 Monocryl suture, which was also used to reapproximate the pyramidalis muscle in the midline.  Anterior rectus fascia was closed in running fashion with 0  looped PDS.  Subcutaneous layer was closed with interrupted plain and the skin was closed in a subcuticular fashion with 4-0 Vicryl on a Keith needle.  Benzoin, Steri-Strips and honeycomb pressure dressing applied.  All counts were correct and the  patient was taken to the recovery room in stable condition.   PUS D: 07/03/2021 11:37:58 am T: 07/03/2021 1:17:00 pm  JOB: [id number]/ [id number]

## 2021-07-03 NOTE — Anesthesia Procedure Notes (Signed)
Spinal  Patient location during procedure: OB Start time: 07/03/2021 10:17 AM End time: 07/03/2021 10:21 AM Reason for block: surgical anesthesia Staffing Performed: anesthesiologist  Anesthesiologist: Trevor Iha, MD Preanesthetic Checklist Completed: patient identified, IV checked, risks and benefits discussed, surgical consent, monitors and equipment checked, pre-op evaluation and timeout performed Spinal Block Patient position: sitting Prep: DuraPrep and site prepped and draped Patient monitoring: heart rate, cardiac monitor, continuous pulse ox and blood pressure Approach: midline Location: L3-4 Injection technique: single-shot Needle Needle type: Pencan  Needle gauge: 24 G Needle length: 10 cm Needle insertion depth: 6 cm Assessment Sensory level: T4 Events: CSF return Additional Notes  1 Attempt (s). Pt tolerated procedure well.

## 2021-07-03 NOTE — Progress Notes (Signed)
No change to H&P per patient history Reviewed procedure-repeat cesarean section and bilateral tubal ligation All questions answered Patient states she understands and agrees

## 2021-07-03 NOTE — Lactation Note (Addendum)
This note was copied from a baby's chart. Lactation Consultation Note  Patient Name: Makayla Kaiser [id number] Date: 07/03/2021 Reason for consult: Initial assessment Age:37 hours  Twins 7 hours old.  Mother states she breastfed her first two children but struggled with her supply and was supplementing. Mother hand expressed a few drops and LC gave baby girl colostrum drops when mother having N&V. Helped clean mother.  Lactation will follow up with Mother later today when mother feels better.   Maternal Data Has patient been taught Hand Expression?: Yes Does the patient have breastfeeding experience prior to this delivery?: Yes   Interventions Interventions: Hand express;Education   Consult Status Consult Status: Follow-up Date: 07/03/21 Follow-up type: In-patient    [name] [facility] 07/03/2021, 5:59 PM

## 2021-07-03 NOTE — Lactation Note (Signed)
This note was copied from Kaiser Makayla's chart. Lactation Consultation Note  Patient Name: Makayla Kaiser [id number] Date: 07/03/2021 Reason for consult: Initial assessment;Late-preterm 34-36.6wks;Infant < 6lbs;Multiple gestation Age:37 hours  Mom's feeding choice is breast and bottle feeding, LEAD was  discussed and Mom declined donor breast milk.  Per mom, Makayla Kaiser ( Makayla) Previously breastfeed for 20 minutes prior to [facility] entering the room. LC did not observe latch with Makayla Kaiser  Makayla Kaiser ( Makayla Kaiser) LC entered the room, mom had Makayla Kaiser latched  on her right breast using the football hold position. LC did not assist with latch, infant  sustain latch during the feeding and breastfeed for 15 minutes,  see flow sheet documents.  Latch score of 9  LC discussed infant's input and output with parents. Mom shown how to use DEBP & how to disassemble, clean, & reassemble parts.  Mom made aware of O/P services, breastfeeding support groups, community resources, and our phone # for post-discharge questions.   Parents understand infants feeding plans may change based on infants feeding behaviors.   Mom's Feeding Plan: 1- Mom will follow [facility]PT[facility] feeding policy ( green sheet). 2-Mom knows to call RN or LC if she has any questions, concerns or needs assistance with latching twins at the breast. 3- After latching twins at the breast, parents will supplement infant with formula using slow flow bottle nipple on Day 1 (5-10 mls ) per feeding or as much as infants want. 5- Mom will use DEBP every 3 hours for 15 minutes on initial setting, mom will give infant back any EBM first before offering formula.    Maternal Data Has patient been taught Hand Expression?: Yes Does the patient have breastfeeding experience prior to this delivery?: Yes How long did the patient breastfeed?: Per mom ,she breastfeed her 1st child for 9 months, 2nd child who is 2 years for 6 months due to returning to work and COVID  concerns  Feeding Mother's Current Feeding Choice: Breast Milk and Formula  LATCH Score                    Lactation Tools Discussed/Used Tools: Pump Breast pump type: Double-Electric Breast Pump Pump Education: Setup, frequency, and cleaning;Milk Storage Reason for Pumping: LPTI and multiple gestation twins Pumping frequency: Mom will pump every 3 hours for 15 minutes on inital setting.  Interventions Interventions: Breast feeding basics reviewed;Skin to skin;Hand express;Breast compression;DEBP;Education;Coconut oil  Discharge Pump: Personal WIC Program: No  Consult Status Consult Status: Follow-up Date: 07/04/21 Follow-up type: In-patient    Danelle Earthly 07/03/2021, 9:27 PM

## 2021-07-04 LAB — CBC
HCT: 30.1 % — ABNORMAL LOW (ref 36.0–46.0)
Hemoglobin: 9.8 g/dL — ABNORMAL LOW (ref 12.0–15.0)
MCH: 28.6 pg (ref 26.0–34.0)
MCHC: 32.6 g/dL (ref 30.0–36.0)
MCV: 87.8 fL (ref 80.0–100.0)
Platelets: 207 10*3/uL (ref 150–400)
RBC: 3.43 MIL/uL — ABNORMAL LOW (ref 3.87–5.11)
RDW: 12.8 % (ref 11.5–15.5)
WBC: 10.3 10*3/uL (ref 4.0–10.5)
nRBC: 0 % (ref 0.0–0.2)

## 2021-07-04 NOTE — Lactation Note (Signed)
This note was copied from a baby's chart. Lactation Consultation Note  Patient Name: Makayla Kaiser [id number] Date: 07/04/2021 Reason for consult: Follow-up assessment;Multiple gestation;Late-preterm 34-36.6wks;Infant < 6lbs Age:37 hours  LC in to visit with P4 Mom of LPT twins.  Both babies are at 4% weight losses. Both babies are breastfeeding before supplementation of 22 cal formula.  Some feedings are 15 mins and some are sleepy and short per Mom.   Mom encouraged to pump after breastfeeding.  Hand's free pumping band provided and assisted Mom to pump for first time using this.   Mom is tired and a bit overwhelmed.  Babies are increasing volumes as tolerated, offering EBM as available.  SLP in room discussing babies.   LC provided a drying bin for pump parts and reminded Mom of importance of disassembling parts when washing and drying. Lactation Tools Discussed/Used Tools: Pump;Bottle;Flanges Flange Size: 24 Breast pump type: Double-Electric Breast Pump Pump Education: Setup, frequency, and cleaning  Interventions Interventions: Skin to skin;Breast massage;Hand express;DEBP;Education   Consult Status Consult Status: Follow-up Date: 07/05/21 Follow-up type: In-patient    Makayla Kaiser 07/04/2021, 5:40 PM

## 2021-07-04 NOTE — Progress Notes (Signed)
Changed honeycomb dressing to C-section incision site per order. Steri strips in place. Site is clean and dry with no signs or symptoms of infection. No concerns noted at this time.

## 2021-07-04 NOTE — Progress Notes (Signed)
POD # 1  C Section Twins  Factor 2 Leiden Mutation  S:  No complaints   O:  BP 120/74 (BP Location: Right Arm)   Pulse 68   Temp 98.2 F (36.8 C) (Oral)   Resp 17   Ht 5\' 6"  (1.676 m)   Wt 108.4 kg   SpO2 98%   Breastfeeding Unknown   BMI 38.58 kg/m  Results for orders placed or performed during the hospital encounter of 07/03/21 (from the past 24 hour(s))  CBC     Status: Abnormal   Collection Time: 07/04/21  6:11 AM  Result Value Ref Range   WBC 10.3 4.0 - 10.5 K/uL   RBC 3.43 (L) 3.87 - 5.11 MIL/uL   Hemoglobin 9.8 (L) 12.0 - 15.0 g/dL   HCT 07/06/21 (L) 92.4 - 46.2 %   MCV 87.8 80.0 - 100.0 fL   MCH 28.6 26.0 - 34.0 pg   MCHC 32.6 30.0 - 36.0 g/dL   RDW 86.3 81.7 - 71.1 %   Platelets 207 150 - 400 K/uL   nRBC 0.0 0.0 - 0.2 %   Abdomen is soft and non tender Dressing saturated on the right side   IMPRESSION: POD # 1  C Section Factor 2 Leiden Mutation  PLAN: Circ for baby boy  Discussed with patient  Initiate Lovenox today at noon

## 2021-07-05 LAB — SURGICAL PATHOLOGY

## 2021-07-05 MED ORDER — OXYCODONE HCL 5 MG PO TABS: 5.0000 mg | ORAL_TABLET | ORAL | 0 refills | Status: DC | PRN

## 2021-07-05 MED ORDER — ENOXAPARIN SODIUM 60 MG/0.6ML IJ SOSY: 40.0000 mg | PREFILLED_SYRINGE | INTRAMUSCULAR | 0 refills | Status: DC

## 2021-07-05 MED ORDER — DOCUSATE SODIUM 100 MG PO CAPS
100.0000 mg | ORAL_CAPSULE | Freq: Every day | ORAL | Status: DC
  Administered 2021-07-05: 100 mg via ORAL
  Filled 2021-07-05: qty 1

## 2021-07-05 MED ORDER — FERROUS SULFATE 325 (65 FE) MG PO TABS
325.0000 mg | ORAL_TABLET | ORAL | Status: DC
  Administered 2021-07-05: 325 mg via ORAL
  Filled 2021-07-05: qty 1

## 2021-07-05 MED ORDER — IBUPROFEN 600 MG PO TABS: 600.0000 mg | ORAL_TABLET | Freq: Four times a day (QID) | ORAL | 0 refills | Status: DC | PRN

## 2021-07-05 MED ORDER — IBUPROFEN 600 MG PO TABS
600.0000 mg | ORAL_TABLET | Freq: Four times a day (QID) | ORAL | Status: DC | PRN
  Administered 2021-07-05 (×2): 600 mg via ORAL
  Filled 2021-07-05 (×2): qty 1

## 2021-07-05 MED ORDER — ACETAMINOPHEN 325 MG PO TABS: 650.0000 mg | ORAL_TABLET | Freq: Four times a day (QID) | ORAL | 0 refills | Status: DC | PRN

## 2021-07-05 NOTE — Progress Notes (Signed)
Postpartum Progress Note  Postpartum Day 2 s/p repeat Cesarean with bilateral tubal ligation. Twins.  She has known factor II deficiency and is on Lovenox.   Subjective:  Patient reports no overnight events.  She reports well controlled pain but notes exacerbations with movement, tight feeling around incision, ambulating without difficulty, voiding spontaneously, tolerating PO.  She reports Negative flatus, Negative BM.  Vaginal bleeding is minimal.  Objective: Blood pressure 122/71, pulse 84, temperature 98.3 F (36.8 C), temperature source Oral, resp. rate 18, height 5\' 6"  (1.676 m), weight 108.4 kg, SpO2 99 %, unknown if currently breastfeeding.  Physical Exam:  General: alert and no distress Lochia: appropriate Uterine Fundus: firm Incision: dressing in place DVT Evaluation: No evidence of DVT seen on physical exam.  Recent Labs    07/02/21 0921 07/04/21 0611  HGB 11.0* 9.8*  HCT 34.4* 30.1*    Assessment/Plan: Postpartum Day 2, s/p C-section, BTL. Twin boy s/p circ. Acute blood loss anemia. Will add Fe/Colace.  Factor II deficiency - Lovenox restarted yesterday Lactation following Doing well, continue routine postpartum care. Anticipate discharge tomorrow to optimize pain control.   LOS: 2 days   07/06/21 07/05/2021, 7:34 AM

## 2021-07-05 NOTE — Discharge Summary (Signed)
Obstetric Discharge Summary  Makayla Kaiser is a 35 y.o. female that presented on 07/03/2021 for scheduled repeat C section. Pregnancy was complicated by Di/Di twin pregnancy, thin uterine window. Due thin uterine window, delivery was recommended at 36 weeks. She has also been on Lovenox for Factor II deficiency and history of DVT on OCP. She delivered viable female and female infants on 07/03/21.  Her postpartum course was uncomplicated and on PPD#2, she reported well controlled pain, spontaneous voiding, ambulating without difficulty, and tolerating PO.  She was stable for discharge home on 07/05/21 with plans for in-office follow up.  Hemoglobin  Date Value Ref Range Status  07/04/2021 9.8 (L) 12.0 - 15.0 g/dL Final   HCT  Date Value Ref Range Status  07/04/2021 30.1 (L) 36.0 - 46.0 % Final    Physical Exam:  General: alert and no distress Lochia: appropriate Uterine Fundus: firm Incision: healing well DVT Evaluation: No evidence of DVT seen on physical exam.  Discharge Diagnoses: Term Pregnancy-delivered  Discharge Information: Date: 07/05/2021 Activity: Pelvic rest, as tolerated Diet: routine Medications: Tylenol, motrin, oxycodone, iron, colace Condition: stable Instructions: Refer to practice specific booklet.  Discussed prior to discharge.  Discharge to: Home   Newborn Data:   Othell, Diluzio [[id number]]  Live born female  Birth Weight: 5 lb 4.1 oz (2385 g) APGAR: 9, 9  Newborn Delivery   Birth date/time: 07/03/2021 10:44:00 Delivery type: C-Section, Low Transverse Trial of labor: No C-section categorization: Repeat       Jadda, Hunsucker [[id number]]  Live born female  Birth Weight: 5 lb 5 oz (2410 g) APGAR: 8, 9  Newborn Delivery   Birth date/time: [birth year]/2021 10:46:00 Delivery type: C-Section, Low Transverse Trial of labor: No C-section categorization: Repeat      Home with mother.  Lyn Henri 07/05/2021, 8:43 PM

## 2021-07-05 NOTE — Lactation Note (Signed)
This note was copied from a baby's chart. Lactation Consultation Note  Patient Name: Makayla Kaiser [id number] Date: 07/05/2021   Age:37 hours  LC attempted to visit with Mom but she was sound asleep.  LC will F/U later.   Judee Clara 07/05/2021, 2:27 PM

## 2021-07-17 ENCOUNTER — Telehealth (HOSPITAL_COMMUNITY): Payer: Self-pay | Admitting: *Deleted

## 2021-07-17 NOTE — Telephone Encounter (Signed)
Left message to return nurse call.  Duffy Rhody, RN 07-17-2021 10:41am

## 2022-01-14 ENCOUNTER — Other Ambulatory Visit: Payer: Self-pay

## 2022-01-14 ENCOUNTER — Ambulatory Visit (INDEPENDENT_AMBULATORY_CARE_PROVIDER_SITE_OTHER): Payer: BC Managed Care – PPO | Admitting: Family Medicine

## 2022-01-14 ENCOUNTER — Encounter (HOSPITAL_BASED_OUTPATIENT_CLINIC_OR_DEPARTMENT_OTHER): Payer: Self-pay | Admitting: Family Medicine

## 2022-01-14 VITALS — BP 130/82 | HR 84 | Ht 66.0 in | Wt 211.8 lb

## 2022-01-14 DIAGNOSIS — F909 Attention-deficit hyperactivity disorder, unspecified type: Secondary | ICD-10-CM | POA: Insufficient documentation

## 2022-01-14 DIAGNOSIS — Z0000 Encounter for general adult medical examination without abnormal findings: Secondary | ICD-10-CM | POA: Diagnosis not present

## 2022-01-14 DIAGNOSIS — Z8249 Family history of ischemic heart disease and other diseases of the circulatory system: Secondary | ICD-10-CM | POA: Diagnosis not present

## 2022-01-14 DIAGNOSIS — R079 Chest pain, unspecified: Secondary | ICD-10-CM | POA: Diagnosis not present

## 2022-01-14 MED ORDER — AMPHETAMINE-DEXTROAMPHETAMINE 20 MG PO TABS: 20.0000 mg | ORAL_TABLET | Freq: Two times a day (BID) | ORAL | 0 refills | Status: DC

## 2022-01-14 NOTE — Assessment & Plan Note (Signed)
Uncertain etiology, does not seem typical for cardiac etiology, did have some laboratory findings suggesting possible gallbladder disease, however given her family history, will refer to cardiology for further evaluation and recommendations ?Consider further evaluation with GI or follow-up with general surgeon pending results of evaluation with cardiology ?

## 2022-01-14 NOTE — Assessment & Plan Note (Signed)
Given her family history and presence of intermittent chest pain, will refer to cardiology for further evaluation and recommendations ?

## 2022-01-14 NOTE — Patient Instructions (Signed)
?  Medication Instructions:  ?Your physician recommends that you continue on your current medications as directed. Please refer to the Current Medication list given to you today. ?--If you need a refill on any your medications before your next appointment, please call your pharmacy first. If no refills are authorized on file call the office.-- ?Lab Work: ?Your physician has recommended that you have lab work today: Full lab panel ?If you have labs (blood work) drawn today and your tests are completely normal, you will receive your results via [place] a phone call from our staff.  ?Please ensure you check your voicemail in the event that you authorized detailed messages to be left on a delegated number. If you have any lab test that is abnormal or we need to change your treatment, we will call you to review the results. ? ?Referrals/Procedures/Imaging: ?Referral to Cardiology ? ? ?Follow-Up: ?Your next appointment:   ?Your physician recommends that you schedule a follow-up appointment in: 1-2 months for CPE with Dr. Tennis Must [place] ? ?You will receive a text message or e-mail with a link to a survey about your care and experience with [place] today! We would greatly appreciate your feedback!  ? ?Thanks for letting us be apart of your health journey!!  ?Primary Care and Sports Medicine  ? ?Dr. Kyung Rudd de [place]  ? ?We encourage you to activate your patient portal called "MyChart".  Sign up information is provided on this After Visit Summary.  MyChart is used to connect with patients for Virtual Visits (Telemedicine).  Patients are able to view lab/test results, encounter notes, upcoming appointments, etc.  Non-urgent messages can be sent to your provider as well. To learn more about what you can do with MyChart, please visit --  [email or web address].    ?

## 2022-01-14 NOTE — Progress Notes (Signed)
? ?New Patient Office Visit ? ?Subjective:  ?Patient ID: Makayla Kaiser, female    DOB: July 08, 1986  Age: 36 y.o. MRN: [id number] ? ?CC:  ?Chief Complaint  ?Patient presents with  ? New Patient (Initial Visit)  ?  Patient presents today to establish care. She is new to the area. She stated she has 80 month old twins, she is done breast feeding and would like to be started back on her adderall 20 mg twice daily. She has a family history of cardiac and would like a referral to cardiology. She also has gall bladder sludge and would like a referral to gastro.    ? ? ?HPI ?Makayla Kaiser is a 36 year old female presenting to establish in clinic.  She has current concerns as outlined above.  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 ? ?ADHD: Managed on Adderall previously, on chart review, medication was being managed by last PCP and patient was taking Adderall 20 mg twice daily.  Requesting prescription to be able to resume medication. ? ?Patient reports history of DVT, this occurred while taking OCPs.  She also has been diagnosed with factor II deficiency. ? ?She has been having infrequent chest pain.  She had an episode which led to emergency department visit about 1 year ago.  At that time evaluation was generally unremarkable except for slightly elevated alkaline phosphatase, lipase, findings of possible gallstones, gallbladder sludge.  She did have evaluation with surgeon, however given borderline findings as well as the fact that she was pregnant with twins, decision was made to hold off on any further evaluation in favor of monitoring.  She did have another episode, however symptoms have not necessarily been associated with any particular food intake, exertion, or other aggravating factors that she is aware of.  She does have some concerns given her family history-she reports that her mother suffered a cardiac arrest when she was 75. ? ?Patient is  originally from [place], has been living here for about 2 years.  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  Outside of work she enjoys hiking, sleeping, being outdoors. ? ?Past Medical History:  ?Diagnosis Date  ? Asthma   ? Factor II deficiency (HCC)   ? History of DVT (deep vein thrombosis)   ? ? ?Past Surgical History:  ?Procedure Laterality Date  ? APPENDECTOMY    ? CESAREAN SECTION    ? CESAREAN SECTION N/A 01/20/2019  ? Procedure: CESAREAN SECTION;  Surgeon: Harold Hedge, MD;  [facility]: [facility];  Service: Obstetrics;  Laterality: N/A;  Repeat ?edc 02/02/19 ?NKDA ?Tracey RNFA  ? CESAREAN SECTION MULTI-GESTATIONAL WITH TUBAL N/A 07/03/2021  ? Procedure: Multi-Gestational CESAREAN SECTION WITH BILATERAL TUBAL LIGATION EDC: 07/28/2021 ALLERG: NKDA  PREVIOUS X 2;  Surgeon: Harold Hedge, MD;  Location: [facility];  Service: Obstetrics;  Laterality: N/A;  ? TONSILLECTOMY    ? ? ?Family History  ?Problem Relation Age of Onset  ? Factor V Leiden deficiency Mother   ? Hypothyroidism Mother   ? Heart disease Mother   ? Factor V Leiden deficiency Sister   ? Clotting disorder Sister   ? ? ?Social History  ? ?Socioeconomic History  ? Marital status: Married  ?  Spouse name: Not on file  ? Number of children: Not on file  ? Years of education: Not on file  ? Highest education level: Not on file  ?Occupational History  ? Not on file  ?Tobacco Use  ?  Smoking status: Never  ? Smokeless tobacco: Never  ?Vaping Use  ? Vaping Use: Never used  ?Substance and Sexual Activity  ? Alcohol use: Not Currently  ? Drug use: Never  ? Sexual activity: Yes  ?Other Topics Concern  ? Not on file  ?Social History Narrative  ? Not on file  ? ?Social Determinants of Health  ? ?Financial Resource Strain: Not on file  ?Food Insecurity: Not on file  ?Transportation Needs: Not on file  ?Physical Activity: Not on file  ?Stress: Not on file  ?Social Connections: Not on file  ?Intimate Partner Violence: Not on file  ? ? ?Objective:   ? ?Today's Vitals: BP 130/82   Pulse 84   Ht 5\' 6"  (1.676 m)   Wt 211 lb 12.8 oz (96.1 kg)   SpO2 94%   BMI 34.19 kg/m?  ? ?Physical Exam ? ?36 year old female in no acute distress ?Cardiovascular exam with regular rate and rhythm, no murmur appreciated ?Lungs clear to auscultation bilaterally ? ?Assessment & Plan:  ? ?Problem List Items Addressed This Visit   ? ?  ? Other  ? Chest pain  ?  Uncertain etiology, does not seem typical for cardiac etiology, did have some laboratory findings suggesting possible gallbladder disease, however given her family history, will refer to cardiology for further evaluation and recommendations ?Consider further evaluation with GI or follow-up with general surgeon pending results of evaluation with cardiology ?  ?  ? Relevant Orders  ? Ambulatory referral to Cardiology  ? Family history of cardiac arrest  ?  Given her family history and presence of intermittent chest pain, will refer to cardiology for further evaluation and recommendations ?  ?  ? Relevant Orders  ? Ambulatory referral to Cardiology  ? ADHD  ?  Reviewed PDMP, no red flags ?Refilled Adderall today ?Monitor progress with resumption of stimulant medication ?  ?  ? Relevant Medications  ? amphetamine-dextroamphetamine (ADDERALL) 20 MG tablet  ? ?Other Visit Diagnoses   ? ? Wellness examination    -  Primary  ? Relevant Orders  ? CBC with Differential/Platelet  ? Comprehensive metabolic panel  ? Hemoglobin A1c  ? Lipid panel  ? TSH Rfx on Abnormal to Free T4  ? ?  ? ? ?Outpatient Encounter Medications as of 01/14/2022  ?Medication Sig  ? amphetamine-dextroamphetamine (ADDERALL) 20 MG tablet Take 1 tablet (20 mg total) by mouth 2 (two) times daily.  ? acetaminophen (TYLENOL) 325 MG tablet Take 2 tablets (650 mg total) by mouth every 6 (six) hours as needed.  ? cetirizine (ZYRTEC) 10 MG tablet Take 10 mg by mouth daily.  ? enoxaparin (LOVENOX) 60 MG/0.6ML injection Inject 0.4 mLs (40 mg total) into the skin daily for 27  days.  ? famotidine (PEPCID) 20 MG tablet Take 20 mg by mouth 2 (two) times daily.  ? ibuprofen (ADVIL) 600 MG tablet Take 1 tablet (600 mg total) by mouth every 6 (six) hours as needed for mild pain or moderate pain.  ? oxyCODONE (OXY IR/ROXICODONE) 5 MG immediate release tablet Take 1 tablet (5 mg total) by mouth every 4 (four) hours as needed for moderate pain.  ? Prenatal Vit-Fe Fumarate-FA (PRENATAL MULTIVITAMIN) TABS tablet Take 1 tablet by mouth in the morning.  ? ?No facility-administered encounter medications on file as of 01/14/2022.  ? ? ?Follow-up: No follow-ups on file.  Plan for follow-up in about 1 to 2 months for CPE, will complete labs today in anticipation of that appointment ? ?  Tzirel Leonor J De [place], MD ? ?

## 2022-01-14 NOTE — Assessment & Plan Note (Signed)
Reviewed PDMP, no red flags ?Refilled Adderall today ?Monitor progress with resumption of stimulant medication ?

## 2022-01-15 LAB — LIPID PANEL
Chol/HDL Ratio: 2.1 ratio (ref 0.0–4.4)
Cholesterol, Total: 156 mg/dL (ref 100–199)
HDL: 73 mg/dL (ref >39)
LDL Chol Calc (NIH): 70 mg/dL (ref 0–99)
Triglycerides: 64 mg/dL (ref 0–149)
VLDL Cholesterol Cal: 13 mg/dL (ref 5–40)

## 2022-01-15 LAB — COMPREHENSIVE METABOLIC PANEL
ALT: 12 IU/L (ref 0–32)
AST: 12 IU/L (ref 0–40)
Albumin/Globulin Ratio: 1.7 (ref 1.2–2.2)
Albumin: 4.7 g/dL (ref 3.8–4.8)
Alkaline Phosphatase: 117 IU/L (ref 44–121)
BUN/Creatinine Ratio: 16 (ref 9–23)
BUN: 12 mg/dL (ref 6–20)
Bilirubin Total: <0.2 mg/dL (ref 0.0–1.2)
CO2: 24 mmol/L (ref 20–29)
Calcium: 9.7 mg/dL (ref 8.7–10.2)
Chloride: 102 mmol/L (ref 96–106)
Creatinine, Ser: 0.75 mg/dL (ref 0.57–1.00)
Globulin, Total: 2.8 g/dL (ref 1.5–4.5)
Glucose: 81 mg/dL (ref 70–99)
Potassium: 4.3 mmol/L (ref 3.5–5.2)
Sodium: 139 mmol/L (ref 134–144)
Total Protein: 7.5 g/dL (ref 6.0–8.5)
eGFR: 104 mL/min/{1.73_m2} (ref >59)

## 2022-01-15 LAB — CBC WITH DIFFERENTIAL/PLATELET
Basophils Absolute: 0 10*3/uL (ref 0.0–0.2)
Basos: 1 %
EOS (ABSOLUTE): 0.2 10*3/uL (ref 0.0–0.4)
Eos: 3 %
Hematocrit: 38.4 % (ref 34.0–46.6)
Hemoglobin: 12.4 g/dL (ref 11.1–15.9)
Immature Grans (Abs): 0 10*3/uL (ref 0.0–0.1)
Immature Granulocytes: 0 %
Lymphocytes Absolute: 2.3 10*3/uL (ref 0.7–3.1)
Lymphs: 36 %
MCH: 26.6 pg (ref 26.6–33.0)
MCHC: 32.3 g/dL (ref 31.5–35.7)
MCV: 82 fL (ref 79–97)
Monocytes Absolute: 0.5 10*3/uL (ref 0.1–0.9)
Monocytes: 8 %
Neutrophils Absolute: 3.3 10*3/uL (ref 1.4–7.0)
Neutrophils: 52 %
Platelets: 340 10*3/uL (ref 150–450)
RBC: 4.66 x10E6/uL (ref 3.77–5.28)
RDW: 13.9 % (ref 11.7–15.4)
WBC: 6.3 10*3/uL (ref 3.4–10.8)

## 2022-01-15 LAB — HEMOGLOBIN A1C
Est. average glucose Bld gHb Est-mCnc: 105 mg/dL
Hgb A1c MFr Bld: 5.3 % (ref 4.8–5.6)

## 2022-01-15 LAB — TSH RFX ON ABNORMAL TO FREE T4: TSH: 3.6 u[IU]/mL (ref 0.450–4.500)

## 2022-01-16 DIAGNOSIS — D239 Other benign neoplasm of skin, unspecified: Secondary | ICD-10-CM | POA: Diagnosis not present

## 2022-01-16 DIAGNOSIS — L814 Other melanin hyperpigmentation: Secondary | ICD-10-CM | POA: Diagnosis not present

## 2022-01-16 DIAGNOSIS — L578 Other skin changes due to chronic exposure to nonionizing radiation: Secondary | ICD-10-CM | POA: Diagnosis not present

## 2022-01-16 DIAGNOSIS — D225 Melanocytic nevi of trunk: Secondary | ICD-10-CM | POA: Diagnosis not present

## 2022-01-22 ENCOUNTER — Ambulatory Visit: Payer: BC Managed Care – PPO | Admitting: Cardiovascular Disease

## 2022-01-25 ENCOUNTER — Ambulatory Visit: Payer: BC Managed Care – PPO | Admitting: Cardiovascular Disease

## 2022-01-25 NOTE — Progress Notes (Deleted)
? ?No chief complaint on file. ? ? ?  ?History of Present Illness: 36 yo Kaiser with history of asthma, Factor II deficiency, prior DVT here today as a new patient for the evaluation of her cardiac risk.  ? ?Primary Care Physician: de [place], Buren Kos, MD ? ? ?Past Medical History:  ?Diagnosis Date  ? Asthma   ? Chest pain   ? Factor II deficiency (HCC)   ? History of DVT (deep vein thrombosis)   ? ? ?Past Surgical History:  ?Procedure Laterality Date  ? APPENDECTOMY    ? CESAREAN SECTION    ? CESAREAN SECTION N/A 01/20/2019  ? Procedure: CESAREAN SECTION;  Surgeon: Harold Hedge, MD;  [facility]: [facility];  Service: Obstetrics;  Laterality: N/A;  Repeat ?edc 02/02/19 ?NKDA ?Tracey RNFA  ? CESAREAN SECTION MULTI-GESTATIONAL WITH TUBAL N/A 07/03/2021  ? Procedure: Multi-Gestational CESAREAN SECTION WITH BILATERAL TUBAL LIGATION EDC: 07/28/2021 ALLERG: NKDA  PREVIOUS X 2;  Surgeon: Harold Hedge, MD;  [facility]: [facility];  Service: Obstetrics;  Laterality: N/A;  ? TONSILLECTOMY    ? ? ?Current Outpatient Medications  ?Medication Sig Dispense Refill  ? acetaminophen (TYLENOL) 325 MG tablet Take 2 tablets (650 mg total) by mouth every 6 (six) hours as needed. 30 tablet 0  ? amphetamine-dextroamphetamine (ADDERALL) 20 MG tablet Take 1 tablet (20 mg total) by mouth 2 (two) times daily. 60 tablet 0  ? cetirizine (ZYRTEC) 10 MG tablet Take 10 mg by mouth daily.    ? famotidine (PEPCID) 20 MG tablet Take 20 mg by mouth 2 (two) times daily.    ? ibuprofen (ADVIL) 600 MG tablet Take 1 tablet (600 mg total) by mouth every 6 (six) hours as needed for mild pain or moderate pain. 30 tablet 0  ? Prenatal Vit-Fe Fumarate-FA (PRENATAL MULTIVITAMIN) TABS tablet Take 1 tablet by mouth in the morning.    ? ?No current facility-administered medications for this visit.  ? ? ?No Known Allergies ? ?Social History  ? ?Socioeconomic History  ? Marital status: Married  ?  Spouse name: Not on file  ? Number of children: Not on file  ? Years of  education: Not on file  ? Highest education level: Not on file  ?Occupational History  ? Not on file  ?Tobacco Use  ? Smoking status: Never  ? Smokeless tobacco: Never  ?Vaping Use  ? Vaping Use: Never used  ?Substance and Sexual Activity  ? Alcohol use: Not Currently  ? Drug use: Never  ? Sexual activity: Yes  ?Other Topics Concern  ? Not on file  ?Social History Narrative  ? Not on file  ? ?Social Determinants of Health  ? ?Financial Resource Strain: Not on file  ?Food Insecurity: Not on file  ?Transportation Needs: Not on file  ?Physical Activity: Not on file  ?Stress: Not on file  ?Social Connections: Not on file  ?Intimate Partner Violence: Not on file  ? ? ?Family History  ?Problem Relation Age of Onset  ? Factor V Leiden deficiency Mother   ? Hypothyroidism Mother   ? Heart disease Mother   ? Factor V Leiden deficiency Sister   ? Clotting disorder Sister   ? ? ?Review of Systems:  As stated in the HPI and otherwise negative.  ? ?There were no vitals taken for this visit. ? ?Physical Examination: ?General: Well developed, well nourished, NAD  ?HEENT: OP clear, mucus membranes moist  ?SKIN: warm, dry. No rashes. ?Neuro: No focal deficits  ?Musculoskeletal:  Muscle strength 5/5 all ext  ?Psychiatric: Mood and affect normal  ?Neck: No JVD, no carotid bruits, no thyromegaly, no lymphadenopathy.  ?Lungs:Clear bilaterally, no wheezes, rhonci, crackles ?Cardiovascular: Regular rate and rhythm. No murmurs, gallops or rubs. ?Abdomen:Soft. Bowel sounds present. Non-tender.  ?Extremities: No lower extremity edema. Pulses are 2 + in the bilateral DP/PT. ? ?EKG:  EKG {ACTION; IS/IS CWC:[id number]} ordered today. ?The ekg ordered today demonstrates *** ? ?Recent Labs: ?01/14/2022: ALT 12; BUN 12; Creatinine, Ser 0.75; Hemoglobin 12.4; Platelets 340; Potassium 4.3; Sodium 139; TSH 3.600  ? ?Lipid Panel ?   ?Component Value Date/Time  ? CHOL 156 01/14/2022 1454  ? TRIG 64 01/14/2022 1454  ? HDL 73 01/14/2022 1454  ? CHOLHDL 2.1  01/14/2022 1454  ? LDLCALC 70 01/14/2022 1454  ? ?  ?Wt Readings from Last 3 Encounters:  ?01/14/22 211 lb 12.8 oz (96.1 kg)  ?07/03/21 239 lb (108.4 kg)  ?06/19/21 239 lb (108.4 kg)  ?  ? ?Other studies Reviewed: ?Additional studies/ records that were reviewed today include: ***. ?Review of the above records demonstrates: *** ? ? ?Assessment and Plan:  ? ?1.  ? ?Current medicines are reviewed at length with the patient today.  The patient {ACTIONS; HAS/DOES NOT HAVE:19233} concerns regarding medicines. ? ?The following changes have been made:  {PLAN; NO CHANGE:13088:s} ? ?Labs/ tests ordered today include: *** ?No orders of the defined types were placed in this encounter. ? ? ? ?Disposition:   F/U with *** in {gen number 1-76:160737} {TIME; UNITS DAY/WEEK/MONTH:19136} ? ? ?Signed, ?Verne Carrow, MD ?01/25/2022 9:03 AM    ?[facility] ?[address] [place], [place], [place]  [address] ?Phone: [phone]; Fax: [phone]  ? ? ?

## 2022-01-30 ENCOUNTER — Ambulatory Visit (HOSPITAL_BASED_OUTPATIENT_CLINIC_OR_DEPARTMENT_OTHER): Payer: Commercial Managed Care - PPO | Admitting: Family Medicine

## 2022-02-06 NOTE — Progress Notes (Signed)
? ? ?Chief Complaint  ?Patient presents with  ? New Patient (Initial Visit)  ?  Cardiac risk assessment  ? ?History of Present Illness: 36 yo female with history of asthma, Factor II deficiency, prior DVT in her arm here today as a new patient for the evaluation of her cardiac risk. She is an emergency medicine physician. She is normally very active. She has twin 65 month old babies at home. She is sleeping poorly. She is has no known cardiac disease. She had an episode of chest pressure while pregnant 10 months ago. This was pain that was squeezing in her chest and constant for several hours. This occurred again last week. She also has dyspnea with moderate exertion. No LE edema. No dizziness or near syncope.  ? ?Primary Care Physician: de Peru, Buren Kos, MD ? ?Past Medical History:  ?Diagnosis Date  ? Asthma   ? Chest pain   ? Factor II deficiency (HCC)   ? History of DVT (deep vein thrombosis)   ? ?Past Surgical History:  ?Procedure Laterality Date  ? APPENDECTOMY    ? CESAREAN SECTION    ? CESAREAN SECTION N/A 01/20/2019  ? Procedure: CESAREAN SECTION;  Surgeon: Harold Hedge, MD;  [facility]: [facility];  Service: Obstetrics;  Laterality: N/A;  Repeat ?edc 02/02/19 ?NKDA ?Tracey RNFA  ? CESAREAN SECTION MULTI-GESTATIONAL WITH TUBAL N/A 07/03/2021  ? Procedure: Multi-Gestational CESAREAN SECTION WITH BILATERAL TUBAL LIGATION EDC: 07/28/2021 ALLERG: NKDA  PREVIOUS X 2;  Surgeon: Harold Hedge, MD;  Location: [facility];  Service: Obstetrics;  Laterality: N/A;  ? TONSILLECTOMY    ? ?Current Outpatient Medications  ?Medication Sig Dispense Refill  ? acetaminophen (TYLENOL) 325 MG tablet Take 2 tablets (650 mg total) by mouth every 6 (six) hours as needed. 30 tablet 0  ? amphetamine-dextroamphetamine (ADDERALL) 20 MG tablet Take 1 tablet (20 mg total) by mouth 2 (two) times daily. 60 tablet 0  ? cetirizine (ZYRTEC) 10 MG tablet Take 10 mg by mouth daily.    ? famotidine (PEPCID) 20 MG tablet Take 20 mg by mouth 2 (two)  times daily.    ? ibuprofen (ADVIL) 600 MG tablet Take 1 tablet (600 mg total) by mouth every 6 (six) hours as needed for mild pain or moderate pain. 30 tablet 0  ? metoprolol tartrate (LOPRESSOR) 100 MG tablet Take 1 tablet (100 mg total) by mouth once for 1 dose. Take 90-120 minutes prior to scan. 1 tablet 0  ? Prenatal Vit-Fe Fumarate-FA (PRENATAL MULTIVITAMIN) TABS tablet Take 1 tablet by mouth in the morning.    ? ?No current facility-administered medications for this visit.  ? ? ?No Known Allergies ? ?Social History  ? ?Socioeconomic History  ? Marital status: Married  ?  Spouse name: Not on file  ? Number of children: 4  ? Years of education: Not on file  ? Highest education level: Not on file  ?Occupational History  ? Occupation: Emergency Medicine Physician  ?Tobacco Use  ? Smoking status: Never  ? Smokeless tobacco: Never  ?Vaping Use  ? Vaping Use: Never used  ?Substance and Sexual Activity  ? Alcohol use: Not Currently  ? Drug use: Never  ? Sexual activity: Yes  ?Other Topics Concern  ? Not on file  ?Social History Narrative  ? Not on file  ? ?Social Determinants of Health  ? ?Financial Resource Strain: Not on file  ?Food Insecurity: Not on file  ?Transportation Needs: Not on file  ?Physical Activity: Not  on file  ?Stress: Not on file  ?Social Connections: Not on file  ?Intimate Partner Violence: Not on file  ? ? ?Family History  ?Problem Relation Age of Onset  ? Factor V Leiden deficiency Mother   ? Hypothyroidism Mother   ? Heart disease Mother 74  ? Factor V Leiden deficiency Sister   ? Clotting disorder Sister   ? ? ?Review of Systems:  As stated in the HPI and otherwise negative.  ? ?BP 136/90   Pulse 71   Ht 5\' 6"  (1.676 m)   Wt 210 lb (95.3 kg)   SpO2 99%   BMI 33.89 kg/m?  ? ?Physical Examination: ?General: Well developed, well nourished, NAD  ?HEENT: OP clear, mucus membranes moist  ?SKIN: warm, dry. No rashes. ?Neuro: No focal deficits  ?Musculoskeletal: Muscle strength 5/5 all ext   ?Psychiatric: Mood and affect normal  ?Neck: No JVD, no carotid bruits, no thyromegaly, no lymphadenopathy.  ?Lungs:Clear bilaterally, no wheezes, rhonci, crackles ?Cardiovascular: Regular rate and rhythm. No murmurs, gallops or rubs. ?Abdomen:Soft. Bowel sounds present. Non-tender.  ?Extremities: No lower extremity edema. Pulses are 2 + in the bilateral DP/PT. ? ?EKG:  EKG is ordered today. ?The ekg ordered today demonstrates Sinus ? ?Recent Labs: ?01/14/2022: ALT 12; BUN 12; Creatinine, Ser 0.75; Hemoglobin 12.4; Platelets 340; Potassium 4.3; Sodium 139; TSH 3.600  ? ?Lipid Panel ?   ?Component Value Date/Time  ? CHOL 156 01/14/2022 1454  ? TRIG 64 01/14/2022 1454  ? HDL 73 01/14/2022 1454  ? CHOLHDL 2.1 01/14/2022 1454  ? LDLCALC 70 01/14/2022 1454  ? ?  ?Wt Readings from Last 3 Encounters:  ?02/07/22 210 lb (95.3 kg)  ?01/14/22 211 lb 12.8 oz (96.1 kg)  ?07/03/21 239 lb (108.4 kg)  ?  ? ?Assessment and Plan:  ? ?1. Chest pain/Dyspnea on exertion: She has been having unexplained dyspnea on exertion and several episodes of chest pain. This has been occurring for the past 6 months. Her mother had a sudden cardiac death at age 40 due to MI and underwent CABG. I will arrange a coronary CTA to exclude CAD and an echo to assess LV systolic function/exclude structural heart disease.  ? ?Labs/ tests ordered today include:  ? ?Orders Placed This Encounter  ?Procedures  ? CT CORONARY MORPH W/CTA COR W/SCORE W/CA W/CM &/OR WO/CM  ? EKG 12-Lead  ? ECHOCARDIOGRAM COMPLETE  ? ? ?Disposition:   F/U as needed.  ? ? ?Signed, ?53, MD ?02/07/2022 10:56 AM    ?[facility] ?[address] [place], [place], [place]  [place] ?Phone: [phone]; Fax: [phone]  ? ? ?

## 2022-02-07 ENCOUNTER — Encounter: Payer: Self-pay | Admitting: Cardiovascular Disease

## 2022-02-07 ENCOUNTER — Ambulatory Visit: Payer: BC Managed Care – PPO | Admitting: Cardiovascular Disease

## 2022-02-07 VITALS — BP 136/90 | HR 71 | Ht 66.0 in | Wt 210.0 lb

## 2022-02-07 DIAGNOSIS — R072 Precordial pain: Secondary | ICD-10-CM | POA: Diagnosis not present

## 2022-02-07 DIAGNOSIS — R079 Chest pain, unspecified: Secondary | ICD-10-CM

## 2022-02-07 DIAGNOSIS — R0609 Other forms of dyspnea: Secondary | ICD-10-CM | POA: Diagnosis not present

## 2022-02-07 MED ORDER — METOPROLOL TARTRATE 100 MG PO TABS: 100.0000 mg | ORAL_TABLET | Freq: Once | ORAL | 0 refills | Status: DC

## 2022-02-07 NOTE — Patient Instructions (Signed)
Medication Instructions:  ?No changes ?*If you need a refill on your cardiac medications before your next appointment, please call your pharmacy* ? ? ?Lab Work: ?none ?If you have labs (blood work) drawn today and your tests are completely normal, you will receive your results only by: ?MyChart Message (if you have MyChart) OR ?A paper copy in the mail ?If you have any lab test that is abnormal or we need to change your treatment, we will call you to review the results. ? ? ?Testing/Procedures: ?Your physician has requested that you have an echocardiogram. Echocardiography is a painless test that uses sound waves to create images of your heart. It provides your doctor with information about the size and shape of your heart and how well your heart?s chambers and valves are working. This procedure takes approximately one hour. There are no restrictions for this procedure. ? ?Coronary CTA  - see instructions below ? ? ?Follow-Up: ?As needed based on test results. ? ? ?Other Instructions ? ? ?Your cardiac CT will be scheduled at  ?[facility] ?[address] ?[place], [place] [address] ?[phone] ? ?Please arrive at the [facility] and [facility] Entrance (Entrance C2) of [facility] 30 minutes prior to test start time. ?You can use the FREE valet parking offered at entrance C (encouraged to control the heart rate for the test)  ?Proceed to the [facility] Radiology Department (first floor) to check-in and test prep. ? ?All radiology patients and guests should use entrance C2 at [facility], accessed from [facility], even though the hospital's physical address listed is [address] ? ? ? ? ?Please follow these instructions carefully (unless otherwise directed): ? ? ?On the Night Before the Test: ?Be sure to Drink plenty of water. ?Do not consume any caffeinated/decaffeinated beverages or chocolate 12 hours prior to your test. ?Do not take any antihistamines 12 hours  prior to your test. (Zyrtec) ? ? ?On the Day of the Test: ?Drink plenty of water until 1 hour prior to the test. ?Do not eat any food 4 hours prior to the test. ?You may take your regular medications prior to the test.  ?FEMALES- please wear underwire-free bra if available, avoid dresses & tight clothing ? ?     ?After the Test: ?Drink plenty of water. ?After receiving IV contrast, you may experience a mild flushed feeling. This is normal. ?On occasion, you may experience a mild rash up to 24 hours after the test. This is not dangerous. If this occurs, you can take Benadryl 25 mg and increase your fluid intake. ?If you experience trouble breathing, this can be serious. If it is severe call 911 IMMEDIATELY. If it is mild, please call our office. ?If you take any of these medications: Glipizide/Metformin, Avandament, Glucavance, please do not take 48 hours after completing test unless otherwise instructed. ? ?We will call to schedule your test 2-4 weeks out understanding that some insurance companies will need an authorization prior to the service being performed.  ? ?For non-scheduling related questions, please contact the cardiac imaging nurse navigator should you have any questions/concerns: ?Rockwell Alexandria, Cardiac Imaging Nurse Navigator ?Larey Brick, Cardiac Imaging Nurse Navigator ?[facility] ?Direct Office Dial: [phone]  ? ?For scheduling needs, including cancellations and rescheduling, please call [place], [phone]. ?  ?

## 2022-02-18 ENCOUNTER — Ambulatory Visit (HOSPITAL_COMMUNITY): Payer: BC Managed Care – PPO

## 2022-02-27 ENCOUNTER — Other Ambulatory Visit (HOSPITAL_BASED_OUTPATIENT_CLINIC_OR_DEPARTMENT_OTHER): Payer: Self-pay | Admitting: Family Medicine

## 2022-02-27 DIAGNOSIS — F909 Attention-deficit hyperactivity disorder, unspecified type: Secondary | ICD-10-CM

## 2022-02-28 MED ORDER — AMPHETAMINE-DEXTROAMPHETAMINE 20 MG PO TABS: 20.0000 mg | ORAL_TABLET | Freq: Two times a day (BID) | ORAL | 0 refills | Status: DC

## 2022-03-14 ENCOUNTER — Other Ambulatory Visit (HOSPITAL_COMMUNITY): Payer: BC Managed Care – PPO

## 2022-03-14 ENCOUNTER — Telehealth (HOSPITAL_COMMUNITY): Payer: Self-pay | Admitting: Emergency Medicine

## 2022-03-14 NOTE — Telephone Encounter (Signed)
Pts states insurance changed and wants to allow time for PA to go thru.  ? ?New appt for 04/02/22 ? ?Marchia Bond RN Navigator Cardiac Imaging ?[facility] ?[phone] Office  ?[phone] Cell ? ?

## 2022-03-15 ENCOUNTER — Ambulatory Visit (HOSPITAL_COMMUNITY): Payer: BC Managed Care – PPO

## 2022-03-26 ENCOUNTER — Ambulatory Visit (HOSPITAL_COMMUNITY): Payer: BC Managed Care – PPO | Attending: Internal Medicine

## 2022-03-26 DIAGNOSIS — I517 Cardiomegaly: Secondary | ICD-10-CM

## 2022-03-26 DIAGNOSIS — R0609 Other forms of dyspnea: Secondary | ICD-10-CM | POA: Insufficient documentation

## 2022-03-26 DIAGNOSIS — R072 Precordial pain: Secondary | ICD-10-CM | POA: Diagnosis not present

## 2022-03-26 DIAGNOSIS — R079 Chest pain, unspecified: Secondary | ICD-10-CM | POA: Insufficient documentation

## 2022-03-26 DIAGNOSIS — I77810 Thoracic aortic ectasia: Secondary | ICD-10-CM | POA: Diagnosis not present

## 2022-03-26 LAB — ECHOCARDIOGRAM COMPLETE
Area-P 1/2: 3.01 cm2
S' Lateral: 3 cm

## 2022-03-29 ENCOUNTER — Telehealth (HOSPITAL_COMMUNITY): Payer: Self-pay | Admitting: Emergency Medicine

## 2022-03-29 ENCOUNTER — Other Ambulatory Visit (HOSPITAL_BASED_OUTPATIENT_CLINIC_OR_DEPARTMENT_OTHER): Payer: Self-pay | Admitting: Family Medicine

## 2022-03-29 DIAGNOSIS — F909 Attention-deficit hyperactivity disorder, unspecified type: Secondary | ICD-10-CM

## 2022-03-29 NOTE — Telephone Encounter (Signed)
Attempted to call patient regarding upcoming cardiac CT appointment. °Left message on voicemail with name and callback number °Pamalee Marcoe RN Navigator Cardiac Imaging °[facility] °[phone] Office °[phone] Cell ° °

## 2022-04-02 ENCOUNTER — Ambulatory Visit (HOSPITAL_COMMUNITY): Payer: BC Managed Care – PPO

## 2022-04-02 MED ORDER — AMPHETAMINE-DEXTROAMPHETAMINE 20 MG PO TABS: 20.0000 mg | ORAL_TABLET | Freq: Two times a day (BID) | ORAL | 0 refills | Status: DC

## 2022-04-12 ENCOUNTER — Telehealth (HOSPITAL_COMMUNITY): Payer: Self-pay | Admitting: Emergency Medicine

## 2022-04-12 NOTE — Telephone Encounter (Signed)
Reaching out to patient to offer assistance regarding upcoming cardiac imaging study; pt verbalizes understanding of appt date/time, parking situation and where to check in, pre-test NPO status and medications ordered, and verified current allergies; name and call back number provided for further questions should they arise ?Sampson Self RN Navigator Cardiac Imaging ?[facility] ?[phone] office ?[phone] cell ? ? ?Denies iv issues  ?100mg metoprolol ?Arrival 730 ?

## 2022-04-15 ENCOUNTER — Encounter (HOSPITAL_COMMUNITY): Payer: Self-pay

## 2022-04-15 ENCOUNTER — Ambulatory Visit (HOSPITAL_COMMUNITY)
Admission: RE | Admit: 2022-04-15 | Discharge: 2022-04-15 | Disposition: A | Discharge status: Home / Self Care | Payer: BC Managed Care – PPO | Source: Ambulatory Visit | Attending: Cardiovascular Disease | Admitting: Cardiovascular Disease

## 2022-04-15 DIAGNOSIS — R0609 Other forms of dyspnea: Secondary | ICD-10-CM | POA: Insufficient documentation

## 2022-04-15 DIAGNOSIS — R072 Precordial pain: Secondary | ICD-10-CM | POA: Diagnosis not present

## 2022-04-15 MED ORDER — NITROGLYCERIN 0.4 MG SL SUBL
0.8000 mg | SUBLINGUAL_TABLET | Freq: Once | SUBLINGUAL | Status: AC
  Administered 2022-04-15: 0.8 mg via SUBLINGUAL

## 2022-04-15 MED ORDER — NITROGLYCERIN 0.4 MG SL SUBL
SUBLINGUAL_TABLET | SUBLINGUAL | Status: AC
  Filled 2022-04-15: qty 2

## 2022-04-15 MED ORDER — IOHEXOL 350 MG/ML SOLN
100.0000 mL | Freq: Once | INTRAVENOUS | Status: AC | PRN
  Administered 2022-04-15: 100 mL via INTRAVENOUS

## 2022-04-17 ENCOUNTER — Other Ambulatory Visit: Payer: Self-pay | Admitting: *Deleted

## 2022-04-17 DIAGNOSIS — I77819 Aortic ectasia, unspecified site: Secondary | ICD-10-CM

## 2022-04-17 NOTE — Progress Notes (Signed)
Order for Ct aorta for one year.

## 2022-05-15 ENCOUNTER — Other Ambulatory Visit (HOSPITAL_BASED_OUTPATIENT_CLINIC_OR_DEPARTMENT_OTHER): Payer: Self-pay | Admitting: Family Medicine

## 2022-05-15 DIAGNOSIS — F909 Attention-deficit hyperactivity disorder, unspecified type: Secondary | ICD-10-CM

## 2022-05-16 MED ORDER — AMPHETAMINE-DEXTROAMPHETAMINE 20 MG PO TABS: 20.0000 mg | ORAL_TABLET | Freq: Two times a day (BID) | ORAL | 0 refills | Status: DC

## 2022-06-10 ENCOUNTER — Other Ambulatory Visit: Payer: Self-pay | Admitting: Obstetrics

## 2022-06-10 DIAGNOSIS — O30043 Twin pregnancy, dichorionic/diamniotic, third trimester: Secondary | ICD-10-CM

## 2022-06-15 ENCOUNTER — Other Ambulatory Visit (HOSPITAL_BASED_OUTPATIENT_CLINIC_OR_DEPARTMENT_OTHER): Payer: Self-pay | Admitting: Family Medicine

## 2022-06-15 DIAGNOSIS — F909 Attention-deficit hyperactivity disorder, unspecified type: Secondary | ICD-10-CM

## 2022-06-18 MED ORDER — AMPHETAMINE-DEXTROAMPHETAMINE 20 MG PO TABS: 20.0000 mg | ORAL_TABLET | Freq: Two times a day (BID) | ORAL | 0 refills | Status: DC

## 2022-07-22 ENCOUNTER — Other Ambulatory Visit (HOSPITAL_BASED_OUTPATIENT_CLINIC_OR_DEPARTMENT_OTHER): Payer: Self-pay | Admitting: Family Medicine

## 2022-07-22 DIAGNOSIS — F909 Attention-deficit hyperactivity disorder, unspecified type: Secondary | ICD-10-CM

## 2022-07-23 MED ORDER — AMPHETAMINE-DEXTROAMPHETAMINE 20 MG PO TABS: 20.0000 mg | ORAL_TABLET | Freq: Two times a day (BID) | ORAL | 0 refills | Status: DC

## 2022-08-16 ENCOUNTER — Ambulatory Visit: Payer: BC Managed Care – PPO | Admitting: Cardiovascular Disease

## 2022-08-21 ENCOUNTER — Other Ambulatory Visit (HOSPITAL_BASED_OUTPATIENT_CLINIC_OR_DEPARTMENT_OTHER): Payer: Self-pay | Admitting: Family Medicine

## 2022-08-21 DIAGNOSIS — F909 Attention-deficit hyperactivity disorder, unspecified type: Secondary | ICD-10-CM

## 2022-08-21 MED ORDER — AMPHETAMINE-DEXTROAMPHETAMINE 20 MG PO TABS: 20.0000 mg | ORAL_TABLET | Freq: Two times a day (BID) | ORAL | 0 refills | Status: DC

## 2022-08-21 NOTE — Telephone Encounter (Signed)
Scheduled

## 2022-09-12 ENCOUNTER — Ambulatory Visit (HOSPITAL_BASED_OUTPATIENT_CLINIC_OR_DEPARTMENT_OTHER): Payer: BC Managed Care – PPO | Admitting: Family Medicine

## 2022-10-23 ENCOUNTER — Other Ambulatory Visit (HOSPITAL_BASED_OUTPATIENT_CLINIC_OR_DEPARTMENT_OTHER): Payer: Self-pay | Admitting: Family Medicine

## 2022-10-23 ENCOUNTER — Encounter (HOSPITAL_BASED_OUTPATIENT_CLINIC_OR_DEPARTMENT_OTHER): Payer: Self-pay | Admitting: Family Medicine

## 2022-10-23 ENCOUNTER — Ambulatory Visit (INDEPENDENT_AMBULATORY_CARE_PROVIDER_SITE_OTHER): Payer: BC Managed Care – PPO | Admitting: Family Medicine

## 2022-10-23 VITALS — BP 135/96 | HR 94 | Temp 97.8°F | Ht 66.0 in | Wt 215.6 lb

## 2022-10-23 DIAGNOSIS — K219 Gastro-esophageal reflux disease without esophagitis: Secondary | ICD-10-CM | POA: Diagnosis not present

## 2022-10-23 DIAGNOSIS — R079 Chest pain, unspecified: Secondary | ICD-10-CM

## 2022-10-23 DIAGNOSIS — F909 Attention-deficit hyperactivity disorder, unspecified type: Secondary | ICD-10-CM

## 2022-10-23 DIAGNOSIS — R030 Elevated blood-pressure reading, without diagnosis of hypertension: Secondary | ICD-10-CM

## 2022-10-23 MED ORDER — AMPHETAMINE-DEXTROAMPHETAMINE 20 MG PO TABS: 20.0000 mg | ORAL_TABLET | Freq: Two times a day (BID) | ORAL | 0 refills | Status: DC

## 2022-10-23 NOTE — Assessment & Plan Note (Signed)
Did have evaluation with cardiology.  She had CAC scoring completed which was reassuring.  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  Recommendation is for follow-up imaging in about 1 year.  She will plan to complete this follow-up with cardiology

## 2022-10-23 NOTE — Assessment & Plan Note (Signed)
She reports history of intermittent reflux symptoms.  She has primarily used H2 blockers for control of symptoms and this has generally worked well.  She has also utilized lifestyle modifications.  Occasionally she will utilize PPI when symptoms are more persistent, utilizing this class medication for about 1 to 2 weeks with good results.  She wonders if further evaluation is needed related to her symptoms.  She has also had occasional abdominal discomfort that will be associated with an episode of diarrhea about once weekly.  She has not noticed any other changes in bowel movements, no blood in the stool. We discussed options today, discussed that if she is having more reflux symptoms, she can proceed with PPI use for about 1 to 2 months to monitor progress and then gradually weaning medication.  If symptoms are becoming more persistent or severe, we can proceed with referral to gastroenterology for further evaluation, consideration for endoscopy

## 2022-10-23 NOTE — Progress Notes (Signed)
    Procedures performed today:    None.  Independent interpretation of notes and tests performed by another provider:   None.  Brief History, Exam, Impression, and Recommendations:    BP (!) 135/96 (BP Location: Left Arm, Patient Position: Sitting, Cuff Size: Large)   Pulse 94   Temp 97.8 F (36.6 C) (Oral)   Ht 5\' 6"  (1.676 m)   Wt 215 lb 9.6 oz (97.8 kg)   SpO2 100%   BMI 34.80 kg/m   ADHD Patient reports that she continues to do well with Adderall at current dosing of 20 mg twice daily.  This does help with controlling symptoms, she has not been noticing any side effects from medication.  She is needing refill of medication.  PDMP reviewed today and no red flags.  Can continue with current dose given good progress with this currently  Elevated blood pressure reading in office without diagnosis of hypertension She has been noticing that at office visits, her blood pressure will be slightly higher than what she will measure at home.  Primary concern related to this was that on recent imaging she was found to have slight dilatation of aorta and she wants to be sure that she is not at risk of having progression or worsening related to uncontrolled blood pressure.  Home readings have generally shown systolic to be in the 120s and diastolic in the 80s.  She has not had any new issues with chest pain At this time, we will continue to monitor blood pressure closely.  Recommend that at next office visit, she bring in her home blood pressure cuff so that we may be able to compare to our reading here in the office If she is noting any issues at home or more elevated readings at home, recommend returning to the office sooner Recommend intermittent monitoring of blood pressure at home, DASH diet  Chest pain Did have evaluation with cardiology.  She had CAC scoring completed which was reassuring.  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  Recommendation is for follow-up  imaging in about 1 year.  She will plan to complete this follow-up with cardiology  GERD (gastroesophageal reflux disease) She reports history of intermittent reflux symptoms.  She has primarily used H2 blockers for control of symptoms and this has generally worked well.  She has also utilized lifestyle modifications.  Occasionally she will utilize PPI when symptoms are more persistent, utilizing this class medication for about 1 to 2 weeks with good results.  She wonders if further evaluation is needed related to her symptoms.  She has also had occasional abdominal discomfort that will be associated with an episode of diarrhea about once weekly.  She has not noticed any other changes in bowel movements, no blood in the stool. We discussed options today, discussed that if she is having more reflux symptoms, she can proceed with PPI use for about 1 to 2 months to monitor progress and then gradually weaning medication.  If symptoms are becoming more persistent or severe, we can proceed with referral to gastroenterology for further evaluation, consideration for endoscopy  Return in about 3 months (around 01/22/2023) for CPE with [facility] a few days prior.   ___________________________________________ Burt Piatek de 01/24/2023, MD, ABFM, CAQSM Primary Care and Sports Medicine [facility]

## 2022-10-23 NOTE — Assessment & Plan Note (Signed)
She has been noticing that at office visits, her blood pressure will be slightly higher than what she will measure at home.  Primary concern related to this was that on recent imaging she was found to have slight dilatation of aorta and she wants to be sure that she is not at risk of having progression or worsening related to uncontrolled blood pressure.  Home readings have generally shown systolic to be in the 120s and diastolic in the 80s.  She has not had any new issues with chest pain At this time, we will continue to monitor blood pressure closely.  Recommend that at next office visit, she bring in her home blood pressure cuff so that we may be able to compare to our reading here in the office If she is noting any issues at home or more elevated readings at home, recommend returning to the office sooner Recommend intermittent monitoring of blood pressure at home, DASH diet

## 2022-10-23 NOTE — Assessment & Plan Note (Signed)
Patient reports that she continues to do well with Adderall at current dosing of 20 mg twice daily.  This does help with controlling symptoms, she has not been noticing any side effects from medication.  She is needing refill of medication.  PDMP reviewed today and no red flags.  Can continue with current dose given good progress with this currently

## 2022-11-27 ENCOUNTER — Other Ambulatory Visit (HOSPITAL_BASED_OUTPATIENT_CLINIC_OR_DEPARTMENT_OTHER): Payer: Self-pay | Admitting: Family Medicine

## 2022-11-27 DIAGNOSIS — F909 Attention-deficit hyperactivity disorder, unspecified type: Secondary | ICD-10-CM

## 2022-11-28 MED ORDER — AMPHETAMINE-DEXTROAMPHETAMINE 20 MG PO TABS: 20.0000 mg | ORAL_TABLET | Freq: Two times a day (BID) | ORAL | 0 refills | Status: DC

## 2023-01-21 ENCOUNTER — Ambulatory Visit (HOSPITAL_BASED_OUTPATIENT_CLINIC_OR_DEPARTMENT_OTHER): Payer: Self-pay

## 2023-01-27 DIAGNOSIS — D239 Other benign neoplasm of skin, unspecified: Secondary | ICD-10-CM | POA: Diagnosis not present

## 2023-01-27 DIAGNOSIS — L814 Other melanin hyperpigmentation: Secondary | ICD-10-CM | POA: Diagnosis not present

## 2023-01-27 DIAGNOSIS — D225 Melanocytic nevi of trunk: Secondary | ICD-10-CM | POA: Diagnosis not present

## 2023-01-27 DIAGNOSIS — L578 Other skin changes due to chronic exposure to nonionizing radiation: Secondary | ICD-10-CM | POA: Diagnosis not present

## 2023-01-29 ENCOUNTER — Encounter (HOSPITAL_BASED_OUTPATIENT_CLINIC_OR_DEPARTMENT_OTHER): Payer: Self-pay | Admitting: Family Medicine

## 2023-02-11 ENCOUNTER — Other Ambulatory Visit (HOSPITAL_BASED_OUTPATIENT_CLINIC_OR_DEPARTMENT_OTHER): Payer: Self-pay | Admitting: Family Medicine

## 2023-02-11 DIAGNOSIS — F909 Attention-deficit hyperactivity disorder, unspecified type: Secondary | ICD-10-CM

## 2023-02-25 ENCOUNTER — Other Ambulatory Visit (HOSPITAL_BASED_OUTPATIENT_CLINIC_OR_DEPARTMENT_OTHER): Payer: Self-pay | Admitting: Family Medicine

## 2023-02-25 DIAGNOSIS — F909 Attention-deficit hyperactivity disorder, unspecified type: Secondary | ICD-10-CM

## 2023-02-26 MED ORDER — AMPHETAMINE-DEXTROAMPHETAMINE 20 MG PO TABS: 20.0000 mg | ORAL_TABLET | Freq: Two times a day (BID) | ORAL | 0 refills | Status: DC

## 2023-03-11 IMAGING — US US MFM FETAL BPP W/O NON-STRESS
1 series · 15 of 28 positions shown · non-contrast
Comparison: none

[Series 1: us mfm fetal bpp w/o non-stress · 56 acquisitions, 15 frames shown]
[im 1/56]
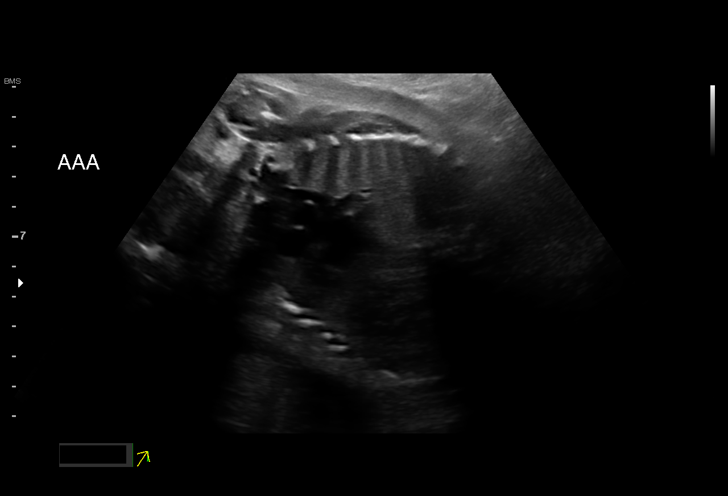
[im 5/56]
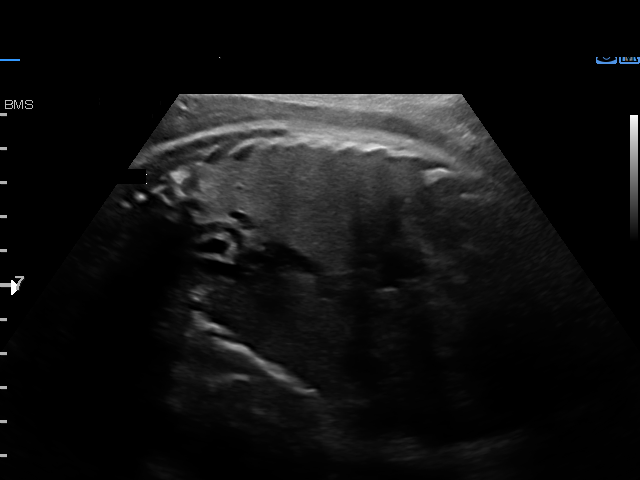
[im 9/56]
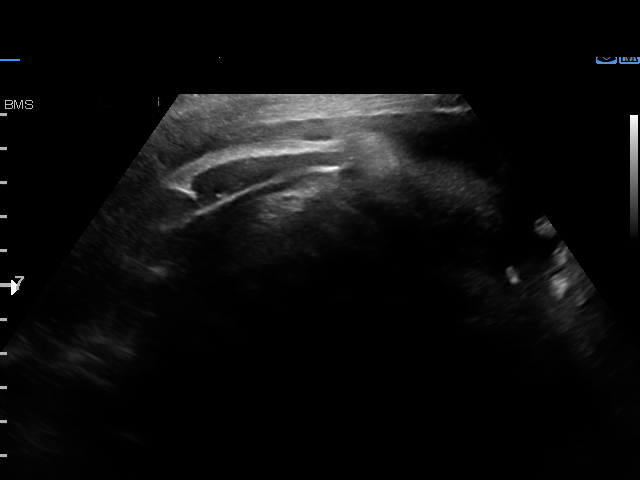
[im 13/56]
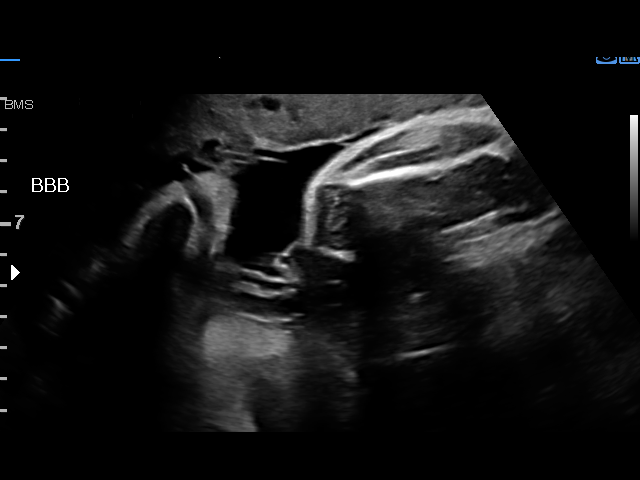
[im 17/56]
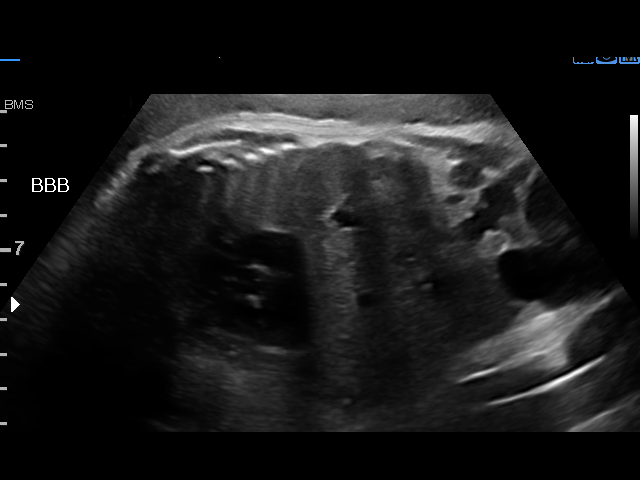
[im 21/56]
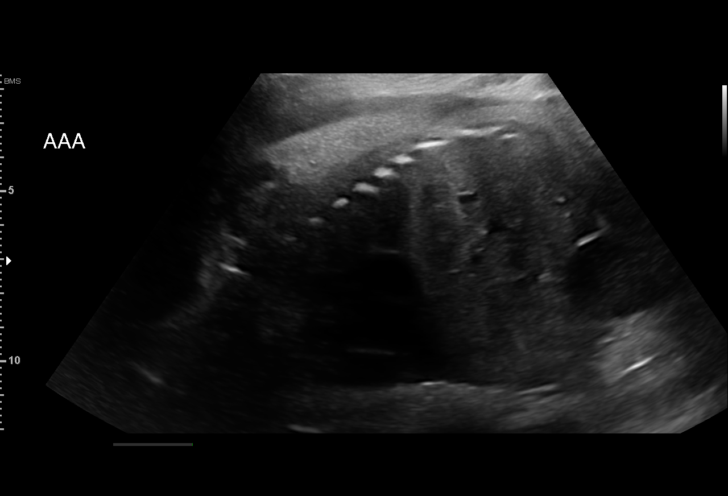
[im 25/56]
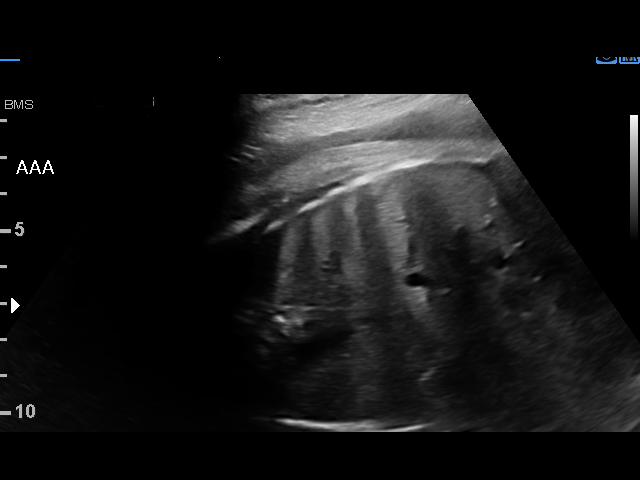
[im 29/56]
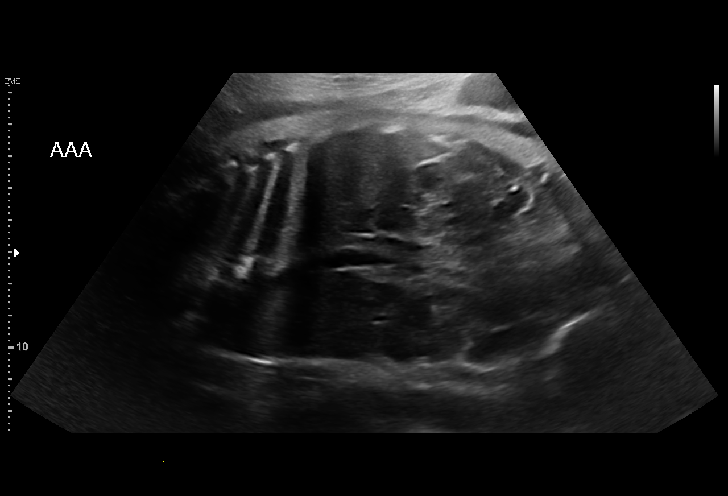
[im 31/56]
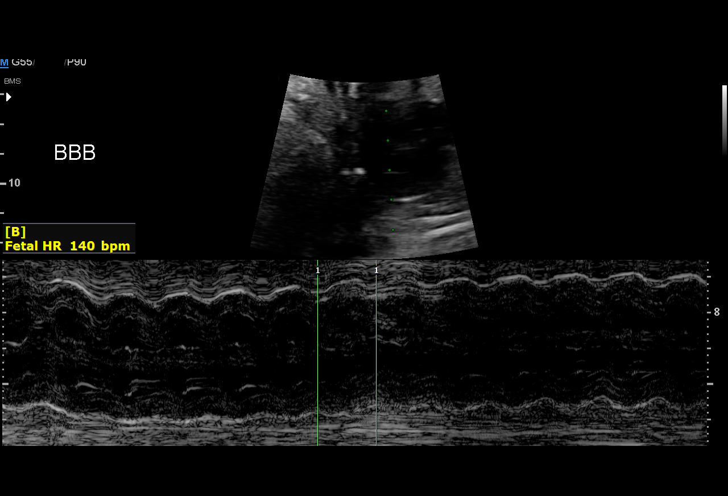
[im 35/56]
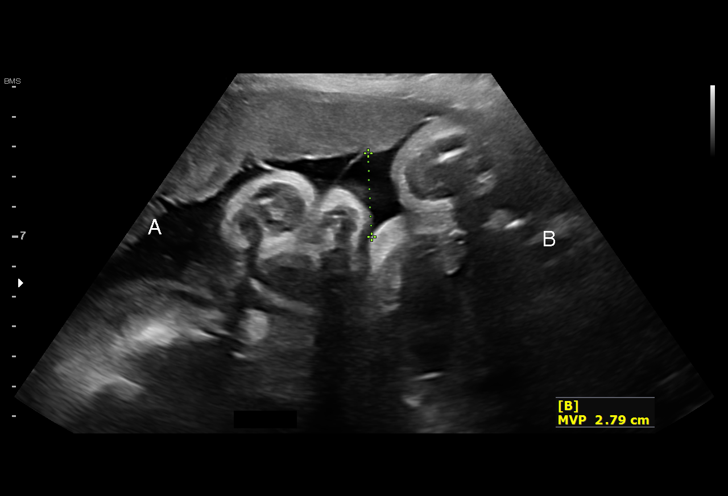
[im 39/56]
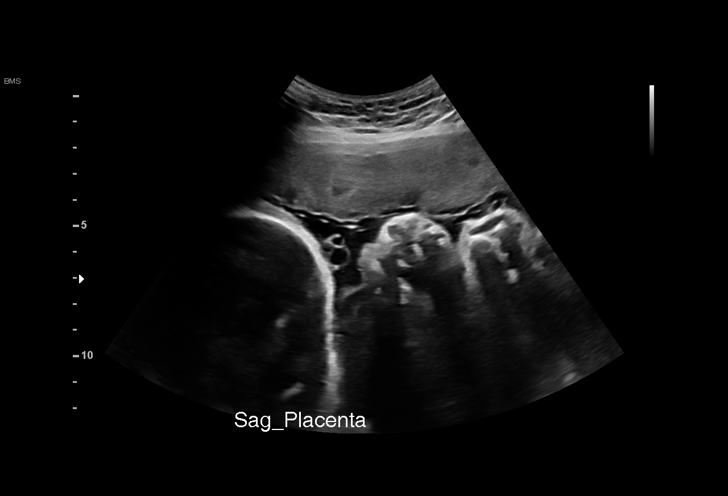
[im 43/56]
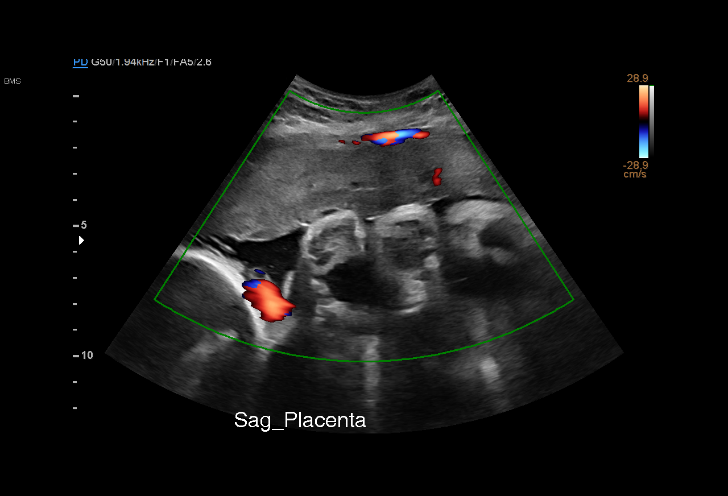
[im 47/56]
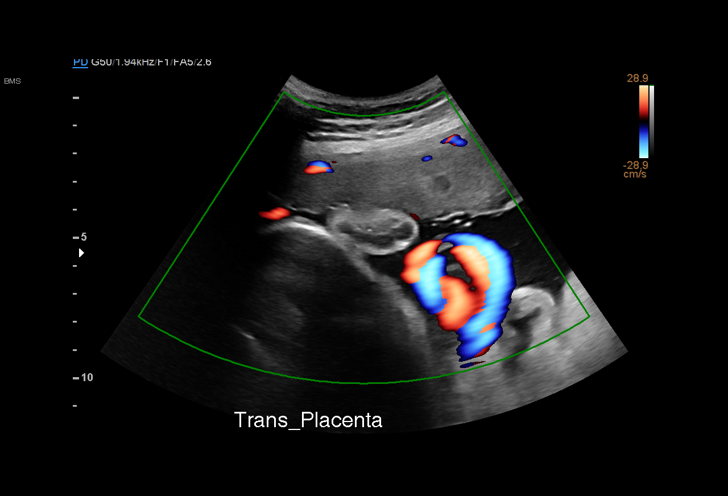
[im 51/56]
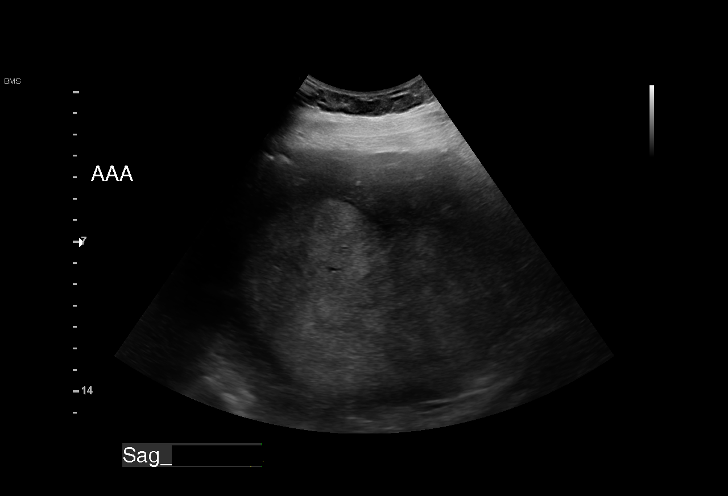
[im 56/56]
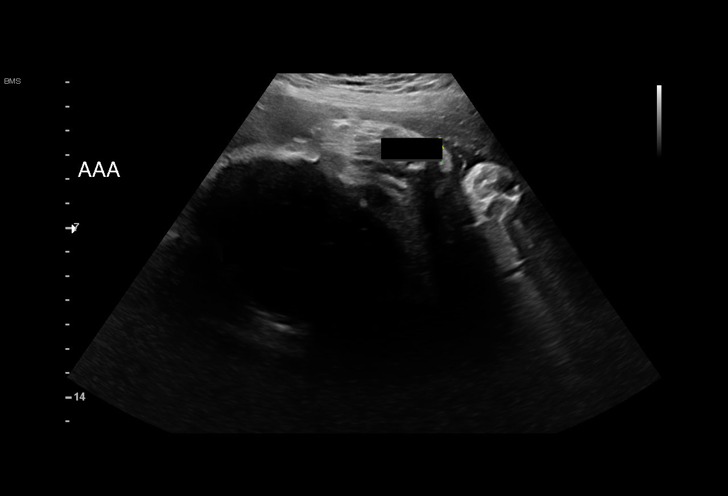

[15 of 28 positions shown; findings below may reference images not displayed]

ADDL GESTATION

Indications

 Non-reactive NST
 Twin pregnancy, di/di, third trimester
 33 weeks gestation of pregnancy
 Abdominal pain in pregnancy
 Medical complication of pregnancy (uterine
 window)
 Advanced maternal age multigravida 35+,
 third trimester (35)
Fetal Evaluation (Fetus A)

 Num Of Fetuses:         2
 Fetal Heart Rate(bpm):  132
 Cardiac Activity:       Observed
 Fetal Lie:              Maternal right side Lower
 Presentation:           Breech
 Placenta:               Posterior

 Amniotic Fluid
 AFI FV:      Within normal limits

                             Largest Pocket(cm)

Biophysical Evaluation (Fetus A)

 Amniotic F.V:   Within normal limits       F. Tone:        Observed
 F. Movement:    Observed                   Score:          [DATE]
 F. Breathing:   Observed
Gestational Age (Fetus A)
 Clinical EDD:  33w 6d                                        EDD:   07/28/21
 Best:          33w 6d     Det. By:  Clinical EDD             EDD:   07/28/21
Anatomy (Fetus A)

 Diaphragm:             Appears normal         Kidneys:                Appear normal
 Stomach:               Appears normal, left   Bladder:                Appears normal
                        sided

Fetal Evaluation (Fetus B)

 Num Of Fetuses:         2
 Fetal Heart Rate(bpm):  140
 Cardiac Activity:       Observed
 Fetal Lie:              Maternal left side Upper
 Presentation:           Breech
 Placenta:               Anterior

 Amniotic Fluid
 AFI FV:      Within normal limits

                             Largest Pocket(cm)

Biophysical Evaluation (Fetus B)

 Amniotic F.V:   Within normal limits       F. Tone:        Observed
 F. Movement:    Observed                   Score:          [DATE]
 F. Breathing:   Observed
Gestational Age (Fetus B)

 Clinical EDD:  33w 6d                                        EDD:   07/28/21
 Best:          33w 6d     Det. By:  Clinical EDD             EDD:   07/28/21
Anatomy (Fetus B)

 Diaphragm:             Appears normal         Kidneys:                Appear normal
 Stomach:               Appears normal, left   Bladder:                Appears normal
                        sided
Cervix Uterus Adnexa

 Cervix
 Not visualized (advanced GA >69wks)
Comments

 This patient was sent to the [facility] following a nonreactive NST
 for both fetuses in the [HOSPITAL].
 A biophysical profile performed today was [DATE] for both
 twin A and twin B.
 There was normal amniotic fluid noted on today's ultrasound
 exam around both fetuses.

## 2023-03-28 ENCOUNTER — Other Ambulatory Visit (HOSPITAL_BASED_OUTPATIENT_CLINIC_OR_DEPARTMENT_OTHER): Payer: Self-pay | Admitting: Family Medicine

## 2023-03-28 DIAGNOSIS — F909 Attention-deficit hyperactivity disorder, unspecified type: Secondary | ICD-10-CM

## 2023-03-31 MED ORDER — AMPHETAMINE-DEXTROAMPHETAMINE 20 MG PO TABS: 20.0000 mg | ORAL_TABLET | Freq: Two times a day (BID) | ORAL | 0 refills | Status: DC

## 2023-04-30 ENCOUNTER — Ambulatory Visit (HOSPITAL_BASED_OUTPATIENT_CLINIC_OR_DEPARTMENT_OTHER): Payer: BC Managed Care – PPO | Admitting: Family Medicine

## 2023-05-01 ENCOUNTER — Encounter (HOSPITAL_BASED_OUTPATIENT_CLINIC_OR_DEPARTMENT_OTHER): Payer: Self-pay | Admitting: Family Medicine

## 2023-05-01 ENCOUNTER — Encounter: Payer: Self-pay | Admitting: Cardiovascular Disease

## 2023-05-01 ENCOUNTER — Ambulatory Visit (INDEPENDENT_AMBULATORY_CARE_PROVIDER_SITE_OTHER): Payer: BC Managed Care – PPO | Admitting: Family Medicine

## 2023-05-01 VITALS — BP 130/82 | HR 88 | Ht 66.0 in | Wt 220.9 lb

## 2023-05-01 DIAGNOSIS — F909 Attention-deficit hyperactivity disorder, unspecified type: Secondary | ICD-10-CM | POA: Diagnosis not present

## 2023-05-01 DIAGNOSIS — Z0000 Encounter for general adult medical examination without abnormal findings: Secondary | ICD-10-CM

## 2023-05-01 MED ORDER — ALBUTEROL SULFATE HFA 108 (90 BASE) MCG/ACT IN AERS: 2.0000 | INHALATION_SPRAY | Freq: Four times a day (QID) | RESPIRATORY_TRACT | 1 refills | Status: DC | PRN

## 2023-05-01 MED ORDER — AMPHETAMINE-DEXTROAMPHETAMINE 20 MG PO TABS: 20.0000 mg | ORAL_TABLET | Freq: Two times a day (BID) | ORAL | 0 refills | Status: DC

## 2023-05-01 NOTE — Assessment & Plan Note (Signed)
Patient reports that she continues to do well with Adderall at current dosing of 20 mg twice daily.  This does help with controlling symptoms, she has noticed any side effects from medication, no issues with chest pain, palpitations, appetite or sleep concerns.  She is needing refill of medication.  PDMP reviewed today and no red flags.  Can continue with current dose given good progress with this currently

## 2023-05-01 NOTE — Progress Notes (Signed)
    Procedures performed today:    None.  Independent interpretation of notes and tests performed by another provider:   None.  Brief History, Exam, Impression, and Recommendations:    BP 130/82 (BP Location: Right Arm, Patient Position: Sitting, Cuff Size: Large)   Pulse 88   Ht 5\' 6"  (1.676 m)   Wt 220 lb 14.4 oz (100.2 kg)   LMP 04/17/2023 (Approximate)   SpO2 100%   BMI 35.65 kg/m   Attention deficit hyperactivity disorder (ADHD), unspecified ADHD type Assessment & Plan: Patient reports that she continues to do well with Adderall at current dosing of 20 mg twice daily.  This does help with controlling symptoms, she has noticed any side effects from medication, no issues with chest pain, palpitations, appetite or sleep concerns.  She is needing refill of medication.  PDMP reviewed today and no red flags.  Can continue with current dose given good progress with this currently  Orders: -     Amphetamine-Dextroamphetamine; Take 1 tablet (20 mg total) by mouth 2 (two) times daily.  Dispense: 60 tablet; Refill: 0  Wellness examination -     CBC with Differential/Platelet; Future -     Hemoglobin A1c; Future -     Comprehensive metabolic panel; Future -     Lipid panel; Future -     TSH Rfx on Abnormal to Free T4; Future  Other orders -     Albuterol Sulfate HFA; Inhale 2 puffs into the lungs every 6 (six) hours as needed for wheezing or shortness of breath.  Dispense: 18 g; Refill: 1  Return in about 3 months (around 08/01/2023) for CPE with fasting labs 1 week prior, med check.   ___________________________________________ Brier Firebaugh de [place], MD, ABFM, [facility] Primary Care and Sports Medicine [facility]

## 2023-05-01 NOTE — Patient Instructions (Signed)

## 2023-05-19 ENCOUNTER — Ambulatory Visit
Admission: RE | Admit: 2023-05-19 | Discharge: 2023-05-19 | Disposition: A | Discharge status: Home / Self Care | Payer: BC Managed Care – PPO | Source: Ambulatory Visit | Attending: Cardiovascular Disease | Admitting: Cardiovascular Disease

## 2023-05-19 DIAGNOSIS — I77810 Thoracic aortic ectasia: Secondary | ICD-10-CM | POA: Diagnosis not present

## 2023-05-19 DIAGNOSIS — I77819 Aortic ectasia, unspecified site: Secondary | ICD-10-CM

## 2023-05-19 MED ORDER — IOPAMIDOL (ISOVUE-370) INJECTION 76%
75.0000 mL | Freq: Once | INTRAVENOUS | Status: AC | PRN
  Administered 2023-05-19: 75 mL via INTRAVENOUS

## 2023-05-20 ENCOUNTER — Other Ambulatory Visit: Payer: Self-pay | Admitting: *Deleted

## 2023-05-20 DIAGNOSIS — I77819 Aortic ectasia, unspecified site: Secondary | ICD-10-CM

## 2023-05-20 NOTE — Progress Notes (Signed)
Order placed for chest CTA in June 2026.

## 2023-06-05 ENCOUNTER — Other Ambulatory Visit (HOSPITAL_BASED_OUTPATIENT_CLINIC_OR_DEPARTMENT_OTHER): Payer: Self-pay | Admitting: Family Medicine

## 2023-06-05 DIAGNOSIS — F909 Attention-deficit hyperactivity disorder, unspecified type: Secondary | ICD-10-CM

## 2023-06-10 MED ORDER — AMPHETAMINE-DEXTROAMPHETAMINE 20 MG PO TABS: 20.0000 mg | ORAL_TABLET | Freq: Two times a day (BID) | ORAL | 0 refills | Status: DC

## 2023-07-07 ENCOUNTER — Other Ambulatory Visit (HOSPITAL_BASED_OUTPATIENT_CLINIC_OR_DEPARTMENT_OTHER): Payer: Self-pay | Admitting: Family Medicine

## 2023-07-15 ENCOUNTER — Other Ambulatory Visit (HOSPITAL_BASED_OUTPATIENT_CLINIC_OR_DEPARTMENT_OTHER): Payer: Self-pay | Admitting: Family Medicine

## 2023-07-15 DIAGNOSIS — F909 Attention-deficit hyperactivity disorder, unspecified type: Secondary | ICD-10-CM

## 2023-07-17 MED ORDER — AMPHETAMINE-DEXTROAMPHETAMINE 20 MG PO TABS: 20.0000 mg | ORAL_TABLET | Freq: Two times a day (BID) | ORAL | 0 refills | Status: DC

## 2023-07-21 ENCOUNTER — Other Ambulatory Visit (HOSPITAL_BASED_OUTPATIENT_CLINIC_OR_DEPARTMENT_OTHER): Payer: BC Managed Care – PPO

## 2023-07-25 ENCOUNTER — Other Ambulatory Visit (HOSPITAL_BASED_OUTPATIENT_CLINIC_OR_DEPARTMENT_OTHER): Payer: BC Managed Care – PPO

## 2023-08-01 ENCOUNTER — Ambulatory Visit (HOSPITAL_BASED_OUTPATIENT_CLINIC_OR_DEPARTMENT_OTHER): Payer: BC Managed Care – PPO | Admitting: Family Medicine

## 2023-08-25 ENCOUNTER — Other Ambulatory Visit (HOSPITAL_BASED_OUTPATIENT_CLINIC_OR_DEPARTMENT_OTHER): Payer: Self-pay | Admitting: Family Medicine

## 2023-08-25 DIAGNOSIS — F909 Attention-deficit hyperactivity disorder, unspecified type: Secondary | ICD-10-CM

## 2023-08-26 MED ORDER — AMPHETAMINE-DEXTROAMPHETAMINE 20 MG PO TABS
20.0000 mg | ORAL_TABLET | Freq: Two times a day (BID) | ORAL | 0 refills | Status: DC
Start: 2023-08-26 — End: 2023-10-14

## 2023-10-14 ENCOUNTER — Other Ambulatory Visit (HOSPITAL_BASED_OUTPATIENT_CLINIC_OR_DEPARTMENT_OTHER): Payer: Self-pay | Admitting: Family Medicine

## 2023-10-14 DIAGNOSIS — F909 Attention-deficit hyperactivity disorder, unspecified type: Secondary | ICD-10-CM

## 2023-10-16 MED ORDER — AMPHETAMINE-DEXTROAMPHETAMINE 20 MG PO TABS
20.0000 mg | ORAL_TABLET | Freq: Two times a day (BID) | ORAL | 0 refills | Status: DC
Start: 2023-10-16 — End: 2023-12-09

## 2023-10-28 NOTE — Telephone Encounter (Signed)
LVM to call the office

## 2023-12-09 ENCOUNTER — Other Ambulatory Visit (HOSPITAL_BASED_OUTPATIENT_CLINIC_OR_DEPARTMENT_OTHER): Payer: Self-pay | Admitting: Family Medicine

## 2023-12-09 ENCOUNTER — Telehealth (HOSPITAL_BASED_OUTPATIENT_CLINIC_OR_DEPARTMENT_OTHER): Payer: Self-pay | Admitting: *Deleted

## 2023-12-09 DIAGNOSIS — F909 Attention-deficit hyperactivity disorder, unspecified type: Secondary | ICD-10-CM

## 2023-12-09 MED ORDER — AMPHETAMINE-DEXTROAMPHETAMINE 20 MG PO TABS
20.0000 mg | ORAL_TABLET | Freq: Two times a day (BID) | ORAL | 0 refills | Status: DC
Start: 2023-12-09 — End: 2024-03-11

## 2023-12-09 NOTE — Telephone Encounter (Signed)
Per dr de Peru called pt to schedule physical see message below LVM  Refill sent to pharmacy.  Patient does need appointment for any further refills after this.  She is also due for physical.  If she would like for her next appointment to the schedule is physical then that would be fine.  Lab orders are in the system as well.  If next appointment is scheduled as physical, recommend that she come to office 1 week prior to have labs completed.

## 2023-12-10 ENCOUNTER — Encounter (HOSPITAL_BASED_OUTPATIENT_CLINIC_OR_DEPARTMENT_OTHER): Payer: Self-pay

## 2024-02-04 ENCOUNTER — Other Ambulatory Visit (HOSPITAL_BASED_OUTPATIENT_CLINIC_OR_DEPARTMENT_OTHER): Payer: Self-pay | Admitting: Family Medicine

## 2024-02-04 DIAGNOSIS — F909 Attention-deficit hyperactivity disorder, unspecified type: Secondary | ICD-10-CM

## 2024-02-05 NOTE — Telephone Encounter (Signed)
LVM to schedule in office appt.

## 2024-03-11 ENCOUNTER — Encounter (HOSPITAL_BASED_OUTPATIENT_CLINIC_OR_DEPARTMENT_OTHER): Payer: Self-pay | Admitting: *Deleted

## 2024-03-11 ENCOUNTER — Ambulatory Visit (INDEPENDENT_AMBULATORY_CARE_PROVIDER_SITE_OTHER): Admitting: Family Medicine

## 2024-03-11 DIAGNOSIS — F909 Attention-deficit hyperactivity disorder, unspecified type: Secondary | ICD-10-CM | POA: Diagnosis not present

## 2024-03-11 DIAGNOSIS — O30049 Twin pregnancy, dichorionic/diamniotic, unspecified trimester: Secondary | ICD-10-CM | POA: Insufficient documentation

## 2024-03-11 DIAGNOSIS — O09529 Supervision of elderly multigravida, unspecified trimester: Secondary | ICD-10-CM | POA: Insufficient documentation

## 2024-03-11 MED ORDER — AMPHETAMINE-DEXTROAMPHETAMINE 20 MG PO TABS: 20.0000 mg | ORAL_TABLET | Freq: Two times a day (BID) | ORAL | 0 refills | Status: DC

## 2024-03-11 NOTE — Progress Notes (Signed)
    Procedures performed today:    None.  Independent interpretation of notes and tests performed by another provider:   None.  Brief History, Exam, Impression, and Recommendations:    BP 126/88 (BP Location: Right Arm, Patient Position: Sitting, Cuff Size: Normal)   Pulse 81   Ht 5\' 6"  (1.676 m)   Wt 190 lb (86.2 kg)   LMP 02/19/2024 (Approximate)   SpO2 100%   BMI 30.67 kg/m   Attention deficit hyperactivity disorder (ADHD), unspecified ADHD type Assessment & Plan: Patient reports that she continues to do well with Adderall at current dosing of 20 mg twice daily.  This does help with controlling symptoms, she has noticed any side effects from medication, no issues with chest pain, palpitations, appetite or sleep concerns.  She is needing refill of medication.  PDMP reviewed today and no red flags.  Can continue with current dose given good progress with this currently  Orders: -     Amphetamine -Dextroamphetamine ; Take 1 tablet (20 mg total) by mouth 2 (two) times daily.  Dispense: 60 tablet; Refill: 0  Return if symptoms worsen or fail to improve.   ___________________________________________ Heather Mckendree de [place], MD, ABFM, CAQSM Primary Care and Sports Medicine [facility]

## 2024-03-11 NOTE — Assessment & Plan Note (Signed)
Patient reports that she continues to do well with Adderall at current dosing of 20 mg twice daily.  This does help with controlling symptoms, she has noticed any side effects from medication, no issues with chest pain, palpitations, appetite or sleep concerns.  She is needing refill of medication.  PDMP reviewed today and no red flags.  Can continue with current dose given good progress with this currently

## 2024-03-16 ENCOUNTER — Encounter (HOSPITAL_BASED_OUTPATIENT_CLINIC_OR_DEPARTMENT_OTHER): Payer: Self-pay | Admitting: *Deleted

## 2024-04-19 ENCOUNTER — Encounter (HOSPITAL_BASED_OUTPATIENT_CLINIC_OR_DEPARTMENT_OTHER): Admitting: Family Medicine

## 2024-05-24 ENCOUNTER — Telehealth (HOSPITAL_BASED_OUTPATIENT_CLINIC_OR_DEPARTMENT_OTHER): Payer: Self-pay | Admitting: Family Medicine

## 2024-05-24 DIAGNOSIS — F909 Attention-deficit hyperactivity disorder, unspecified type: Secondary | ICD-10-CM

## 2024-05-24 MED ORDER — AMPHETAMINE-DEXTROAMPHETAMINE 20 MG PO TABS: 20.0000 mg | ORAL_TABLET | Freq: Two times a day (BID) | ORAL | 0 refills | Status: DC

## 2024-05-24 NOTE — Telephone Encounter (Signed)
 Copied from CRM [id number]. Topic: Clinical - Medication Refill >> May 24, 2024  9:56 AM Everette C wrote: Medication: amphetamine -dextroamphetamine  (ADDERALL) 20 MG tablet  Has the patient contacted their pharmacy? Yes (Agent: If no, request that the patient contact the pharmacy for the refill. If patient does not wish to contact the pharmacy document the reason why and proceed with request.) (Agent: If yes, when and what did the pharmacy advise?)  This is the patient's preferred pharmacy:  [id number] - [place], [place] - [address] [place] [address] Phone: [phone] Fax: [phone]  Is this the correct pharmacy for this prescription? Yes If no, delete pharmacy and type the correct one.   Has the prescription been filled recently? Yes  Is the patient out of the medication? Yes  Has the patient been seen for an appointment in the last year OR does the patient have an upcoming appointment? Yes  Can we respond through MyChart? No  Agent: Please be advised that Rx refills may take up to 3 business days. We ask that you follow-up with your pharmacy.

## 2024-06-28 ENCOUNTER — Other Ambulatory Visit (HOSPITAL_BASED_OUTPATIENT_CLINIC_OR_DEPARTMENT_OTHER): Payer: Self-pay | Admitting: Family Medicine

## 2024-06-28 DIAGNOSIS — F909 Attention-deficit hyperactivity disorder, unspecified type: Secondary | ICD-10-CM

## 2024-06-28 NOTE — Telephone Encounter (Signed)
 Copied from CRM #[id number]. Topic: Clinical - Medication Refill >> Jun 28, 2024  3:21 PM Turkey B wrote: Medication: amphetamine -dextroamphetamine  (ADDERALL) 20 MG tablet  Has the patient contacted their pharmacy? no  This is the patient's preferred pharmacy:  [id number] - [place], [place] - [address] [place] [address] Phone: [phone] Fax: [phone]  Is this the correct pharmacy for this prescription? yes  Has the prescription been filled recently? no  Is the patient out of the medication? yes  Has the patient been seen for an appointment in the last year OR does the patient have an upcoming appointment? yes  Can we respond through MyChart? yes  Agent: Please be advised that Rx refills may take up to 3 business days. We ask that you follow-up with your pharmacy.

## 2024-06-29 MED ORDER — AMPHETAMINE-DEXTROAMPHETAMINE 20 MG PO TABS: 20.0000 mg | ORAL_TABLET | Freq: Two times a day (BID) | ORAL | 0 refills | Status: DC

## 2024-08-11 ENCOUNTER — Other Ambulatory Visit (HOSPITAL_BASED_OUTPATIENT_CLINIC_OR_DEPARTMENT_OTHER): Payer: Self-pay | Admitting: Family Medicine

## 2024-08-11 DIAGNOSIS — F909 Attention-deficit hyperactivity disorder, unspecified type: Secondary | ICD-10-CM

## 2024-08-11 MED ORDER — AMPHETAMINE-DEXTROAMPHETAMINE 20 MG PO TABS: 20.0000 mg | ORAL_TABLET | Freq: Two times a day (BID) | ORAL | 0 refills | Status: DC

## 2024-08-11 NOTE — Telephone Encounter (Signed)
 Copied from CRM [id number]. Topic: Clinical - Medication Refill >> Aug 11, 2024  1:37 PM Winona R wrote: Medication:  amphetamine -dextroamphetamine  (ADDERALL) 20 MG tablet   Has the patient contacted their pharmacy? Yes (Agent: If no, request that the patient contact the pharmacy for the refill. If patient does not wish to contact the pharmacy document the reason why and proceed with request.) (Agent: If yes, when and what did the pharmacy advise?)  This is the patient's preferred pharmacy:  [id number] - [place], [place] - [address] [place] [address] Phone: [phone] Fax: [phone]  Is this the correct pharmacy for this prescription? Yes If no, delete pharmacy and type the correct one.   Has the prescription been filled recently? Yes  Is the patient out of the medication? Yes  Has the patient been seen for an appointment in the last year OR does the patient have an upcoming appointment? Yes  Can we respond through MyChart? Yes  Agent: Please be advised that Rx refills may take up to 3 business days. We ask that you follow-up with your pharmacy.

## 2024-09-22 ENCOUNTER — Other Ambulatory Visit (HOSPITAL_BASED_OUTPATIENT_CLINIC_OR_DEPARTMENT_OTHER): Payer: Self-pay | Admitting: Family Medicine

## 2024-09-22 DIAGNOSIS — F909 Attention-deficit hyperactivity disorder, unspecified type: Secondary | ICD-10-CM

## 2024-09-22 NOTE — Telephone Encounter (Signed)
 Copied from CRM [id number]. Topic: Clinical - Medication Refill >> Sep 22, 2024  5:01 PM Shanda MATSU wrote: Medication: amphetamine -dextroamphetamine  (ADDERALL) 20 MG tablet  Has the patient contacted their pharmacy? Yes, referred to provider (Agent: If no, request that the patient contact the pharmacy for the refill. If patient does not wish to contact the pharmacy document the reason why and proceed with request.) (Agent: If yes, when and what did the pharmacy advise?)  This is the patient's preferred pharmacy:  [id number] - [place], [place] - [address] [place] [address] Phone: [phone] Fax: [phone]  Is this the correct pharmacy for this prescription? Yes If no, delete pharmacy and type the correct one.   Has the prescription been filled recently? No  Is the patient out of the medication? Yes  Has the patient been seen for an appointment in the last year OR does the patient have an upcoming appointment? No  Can we respond through MyChart? Yes  Agent: Please be advised that Rx refills may take up to 3 business days. We ask that you follow-up with your pharmacy.

## 2024-09-23 MED ORDER — AMPHETAMINE-DEXTROAMPHETAMINE 20 MG PO TABS: 20.0000 mg | ORAL_TABLET | Freq: Two times a day (BID) | ORAL | 0 refills | Status: DC

## 2024-11-19 ENCOUNTER — Telehealth (HOSPITAL_BASED_OUTPATIENT_CLINIC_OR_DEPARTMENT_OTHER): Payer: Self-pay | Admitting: Family Medicine

## 2024-11-19 DIAGNOSIS — F909 Attention-deficit hyperactivity disorder, unspecified type: Secondary | ICD-10-CM

## 2024-11-19 NOTE — Telephone Encounter (Unsigned)
 Copied from CRM #[id number]. Topic: Clinical - Medication Refill >> Nov 19, 2024  1:44 PM Harlene ORN wrote: Medication: amphetamine -dextroamphetamine  (ADDERALL) 20 MG tablet  Has the patient contacted their pharmacy? Yes (Agent: If no, request that the patient contact the pharmacy for the refill. If patient does not wish to contact the pharmacy document the reason why and proceed with request.) (Agent: If yes, when and what did the pharmacy advise?)  This is the patient's preferred pharmacy:  [id number] - [place], [place] - [address] [place] [address] Phone: [phone] Fax: [phone]  Is this the correct pharmacy for this prescription? Yes If no, delete pharmacy and type the correct one.   Has the prescription been filled recently? No  Is the patient out of the medication? No  Has the patient been seen for an appointment in the last year OR does the patient have an upcoming appointment? Yes  Can we respond through MyChart? Yes  Agent: Please be advised that Rx refills may take up to 3 business days. We ask that you follow-up with your pharmacy.

## 2024-11-24 MED ORDER — AMPHETAMINE-DEXTROAMPHETAMINE 20 MG PO TABS: 20.0000 mg | ORAL_TABLET | Freq: Two times a day (BID) | ORAL | 0 refills | Status: AC

## 2024-12-22 ENCOUNTER — Ambulatory Visit (HOSPITAL_BASED_OUTPATIENT_CLINIC_OR_DEPARTMENT_OTHER): Admitting: Family Medicine
